# Patient Record
Sex: Female | Born: 1965
Health system: Southern US, Community
[De-identification: ages and names within clinical notes are randomized; demographics above are authoritative.]

## PROBLEM LIST (undated history)

## (undated) DIAGNOSIS — T7840XA Allergy, unspecified, initial encounter: Secondary | ICD-10-CM

## (undated) HISTORY — PX: EYE SURGERY: SHX253

## (undated) HISTORY — PX: OTHER SURGICAL HISTORY: SHX169

## (undated) HISTORY — PX: TUBAL LIGATION: SHX77

## (undated) HISTORY — PX: KNEE SURGERY: SHX244

## (undated) HISTORY — DX: Allergy, unspecified, initial encounter: T78.40XA

---

## 1998-09-20 ENCOUNTER — Encounter: Payer: Self-pay | Admitting: Obstetrics and Gynecology

## 1998-09-20 ENCOUNTER — Ambulatory Visit (HOSPITAL_COMMUNITY): Admission: RE | Admit: 1998-09-20 | Discharge: 1998-09-20 | Payer: Self-pay | Admitting: Family Medicine

## 1999-03-20 ENCOUNTER — Ambulatory Visit (HOSPITAL_COMMUNITY): Admission: RE | Admit: 1999-03-20 | Discharge: 1999-03-20 | Payer: Self-pay | Admitting: Obstetrics and Gynecology

## 1999-09-19 ENCOUNTER — Ambulatory Visit (HOSPITAL_COMMUNITY): Admission: RE | Admit: 1999-09-19 | Discharge: 1999-09-19 | Payer: Self-pay | Admitting: Obstetrics and Gynecology

## 1999-09-19 ENCOUNTER — Encounter: Payer: Self-pay | Admitting: Obstetrics and Gynecology

## 1999-10-03 ENCOUNTER — Other Ambulatory Visit: Admission: RE | Admit: 1999-10-03 | Discharge: 1999-10-03 | Payer: Self-pay | Admitting: Family Medicine

## 2000-10-02 ENCOUNTER — Other Ambulatory Visit: Admission: RE | Admit: 2000-10-02 | Discharge: 2000-10-02 | Payer: Self-pay | Admitting: *Deleted

## 2001-09-24 ENCOUNTER — Other Ambulatory Visit: Admission: RE | Admit: 2001-09-24 | Discharge: 2001-09-24 | Payer: Self-pay | Admitting: Family Medicine

## 2002-10-08 ENCOUNTER — Other Ambulatory Visit: Admission: RE | Admit: 2002-10-08 | Discharge: 2002-10-08 | Payer: Self-pay | Admitting: Family Medicine

## 2003-10-18 ENCOUNTER — Other Ambulatory Visit: Admission: RE | Admit: 2003-10-18 | Discharge: 2003-10-18 | Payer: Self-pay | Admitting: Family Medicine

## 2004-09-20 ENCOUNTER — Ambulatory Visit: Payer: Self-pay | Admitting: Internal Medicine

## 2004-10-24 ENCOUNTER — Ambulatory Visit (HOSPITAL_COMMUNITY): Admission: RE | Admit: 2004-10-24 | Discharge: 2004-10-24 | Payer: Self-pay | Admitting: Internal Medicine

## 2004-10-24 ENCOUNTER — Other Ambulatory Visit: Admission: RE | Admit: 2004-10-24 | Discharge: 2004-10-24 | Payer: Self-pay | Admitting: Family Medicine

## 2005-01-05 ENCOUNTER — Ambulatory Visit: Payer: Self-pay | Admitting: Internal Medicine

## 2005-01-24 ENCOUNTER — Ambulatory Visit: Payer: Self-pay | Admitting: Internal Medicine

## 2005-06-18 ENCOUNTER — Ambulatory Visit: Payer: Self-pay | Admitting: Internal Medicine

## 2005-07-18 ENCOUNTER — Ambulatory Visit: Payer: Self-pay | Admitting: Internal Medicine

## 2005-08-07 ENCOUNTER — Ambulatory Visit: Payer: Self-pay | Admitting: Internal Medicine

## 2005-08-14 ENCOUNTER — Ambulatory Visit: Payer: Self-pay | Admitting: Internal Medicine

## 2005-08-27 ENCOUNTER — Ambulatory Visit: Payer: Self-pay | Admitting: Internal Medicine

## 2005-10-04 ENCOUNTER — Ambulatory Visit: Payer: Self-pay | Admitting: Internal Medicine

## 2005-11-07 ENCOUNTER — Other Ambulatory Visit: Admission: RE | Admit: 2005-11-07 | Discharge: 2005-11-07 | Payer: Self-pay | Admitting: Family Medicine

## 2005-12-13 ENCOUNTER — Ambulatory Visit: Payer: Self-pay | Admitting: Internal Medicine

## 2006-04-16 ENCOUNTER — Ambulatory Visit: Payer: Self-pay | Admitting: Internal Medicine

## 2006-09-04 ENCOUNTER — Ambulatory Visit: Payer: Self-pay | Admitting: Internal Medicine

## 2007-01-09 ENCOUNTER — Other Ambulatory Visit: Admission: RE | Admit: 2007-01-09 | Discharge: 2007-01-09 | Payer: Self-pay | Admitting: Family Medicine

## 2007-01-09 ENCOUNTER — Encounter: Admission: RE | Admit: 2007-01-09 | Discharge: 2007-01-09 | Payer: Self-pay | Admitting: Family Medicine

## 2007-01-14 ENCOUNTER — Ambulatory Visit: Payer: Self-pay | Admitting: Internal Medicine

## 2007-01-30 ENCOUNTER — Ambulatory Visit: Payer: Self-pay | Admitting: Internal Medicine

## 2007-02-18 ENCOUNTER — Ambulatory Visit: Payer: Self-pay | Admitting: Internal Medicine

## 2007-04-09 ENCOUNTER — Ambulatory Visit: Payer: Self-pay | Admitting: Internal Medicine

## 2008-02-13 ENCOUNTER — Ambulatory Visit (HOSPITAL_COMMUNITY): Admission: RE | Admit: 2008-02-13 | Discharge: 2008-02-13 | Payer: Self-pay | Admitting: Family Medicine

## 2008-02-13 ENCOUNTER — Other Ambulatory Visit: Admission: RE | Admit: 2008-02-13 | Discharge: 2008-02-13 | Payer: Self-pay | Admitting: Family Medicine

## 2008-07-08 ENCOUNTER — Encounter: Admission: RE | Admit: 2008-07-08 | Discharge: 2008-07-08 | Payer: Self-pay | Admitting: Gastroenterology

## 2008-11-03 ENCOUNTER — Telehealth (INDEPENDENT_AMBULATORY_CARE_PROVIDER_SITE_OTHER): Payer: Self-pay | Admitting: *Deleted

## 2008-11-11 ENCOUNTER — Ambulatory Visit: Payer: Self-pay | Admitting: Internal Medicine

## 2008-11-11 DIAGNOSIS — J302 Other seasonal allergic rhinitis: Secondary | ICD-10-CM | POA: Insufficient documentation

## 2008-11-11 DIAGNOSIS — J3089 Other allergic rhinitis: Secondary | ICD-10-CM

## 2008-11-12 ENCOUNTER — Ambulatory Visit: Payer: Self-pay | Admitting: Internal Medicine

## 2009-02-24 ENCOUNTER — Other Ambulatory Visit: Admission: RE | Admit: 2009-02-24 | Discharge: 2009-02-24 | Payer: Self-pay | Admitting: Family Medicine

## 2009-02-24 ENCOUNTER — Ambulatory Visit (HOSPITAL_COMMUNITY): Admission: RE | Admit: 2009-02-24 | Discharge: 2009-02-24 | Payer: Self-pay | Admitting: Family Medicine

## 2009-05-17 ENCOUNTER — Encounter: Payer: Self-pay | Admitting: Internal Medicine

## 2009-05-17 ENCOUNTER — Telehealth (INDEPENDENT_AMBULATORY_CARE_PROVIDER_SITE_OTHER): Payer: Self-pay | Admitting: *Deleted

## 2009-07-13 ENCOUNTER — Ambulatory Visit: Payer: Self-pay | Admitting: Internal Medicine

## 2010-02-01 ENCOUNTER — Ambulatory Visit: Payer: Self-pay | Admitting: Internal Medicine

## 2010-02-03 ENCOUNTER — Encounter: Payer: Self-pay | Admitting: Family Medicine

## 2010-02-15 ENCOUNTER — Encounter: Admission: RE | Admit: 2010-02-15 | Discharge: 2010-02-15 | Payer: Self-pay | Admitting: Family Medicine

## 2010-02-15 ENCOUNTER — Other Ambulatory Visit: Admission: RE | Admit: 2010-02-15 | Discharge: 2010-02-15 | Payer: Self-pay | Admitting: Family Medicine

## 2010-02-15 ENCOUNTER — Ambulatory Visit: Payer: Self-pay | Admitting: Family Medicine

## 2010-02-15 ENCOUNTER — Encounter (INDEPENDENT_AMBULATORY_CARE_PROVIDER_SITE_OTHER): Payer: Self-pay | Admitting: *Deleted

## 2010-02-20 ENCOUNTER — Ambulatory Visit: Payer: Self-pay | Admitting: Family Medicine

## 2010-02-20 ENCOUNTER — Encounter (INDEPENDENT_AMBULATORY_CARE_PROVIDER_SITE_OTHER): Payer: Self-pay | Admitting: *Deleted

## 2010-02-21 ENCOUNTER — Telehealth (INDEPENDENT_AMBULATORY_CARE_PROVIDER_SITE_OTHER): Payer: Self-pay | Admitting: *Deleted

## 2010-02-21 LAB — CONVERTED CEMR LAB: Fecal Occult Bld: NEGATIVE

## 2010-02-23 ENCOUNTER — Ambulatory Visit: Payer: Self-pay | Admitting: Family Medicine

## 2010-02-24 LAB — CONVERTED CEMR LAB
AST: 20 units/L (ref 0–37)
Albumin: 3.9 g/dL (ref 3.5–5.2)
Alkaline Phosphatase: 52 units/L (ref 39–117)
BUN: 14 mg/dL (ref 6–23)
Basophils Relative: 0.6 % (ref 0.0–3.0)
CO2: 30 meq/L (ref 19–32)
Eosinophils Relative: 2.5 % (ref 0.0–5.0)
GFR calc non Af Amer: 98.57 mL/min (ref >60)
Glucose, Bld: 92 mg/dL (ref 70–99)
HCT: 33 % — ABNORMAL LOW (ref 36.0–46.0)
Lymphs Abs: 1.7 10*3/uL (ref 0.7–4.0)
MCV: 81.2 fL (ref 78.0–100.0)
Monocytes Absolute: 0.5 10*3/uL (ref 0.1–1.0)
Monocytes Relative: 8.5 % (ref 3.0–12.0)
Neutrophils Relative %: 61 % (ref 43.0–77.0)
Potassium: 5 meq/L (ref 3.5–5.1)
RBC: 4.06 M/uL (ref 3.87–5.11)
TSH: 1.69 microintl units/mL (ref 0.35–5.50)
Total Protein: 7 g/dL (ref 6.0–8.3)
WBC: 6.2 10*3/uL (ref 4.5–10.5)

## 2010-10-03 ENCOUNTER — Ambulatory Visit
Admission: RE | Admit: 2010-10-03 | Discharge: 2010-10-03 | Payer: Self-pay | Source: Home / Self Care | Attending: Family Medicine | Admitting: Family Medicine

## 2010-10-03 DIAGNOSIS — M533 Sacrococcygeal disorders, not elsewhere classified: Secondary | ICD-10-CM | POA: Insufficient documentation

## 2010-10-06 ENCOUNTER — Ambulatory Visit
Admission: RE | Admit: 2010-10-06 | Discharge: 2010-10-06 | Payer: Self-pay | Source: Home / Self Care | Attending: Family Medicine | Admitting: Family Medicine

## 2010-10-12 ENCOUNTER — Telehealth: Payer: Self-pay | Admitting: Family Medicine

## 2010-10-17 NOTE — Letter (Signed)
Summary: [facility]: Immunoassay Fecal Occult Blood (iFOB) Order Form  [facility] [place]  [address] [place], [place] [address]   Phone: [phone]  Fax: [phone]      [facility]: Immunoassay Fecal Occult Blood (iFOB) Order Form   February 15, 2010 MRN: [id number]   Erika Wagner [birth year]   Physicican Name:____yvonne lowne,do_____________________  Diagnosis Code:________v76.51__________________      [name] CMA

## 2010-10-17 NOTE — Letter (Signed)
Summary: Results Follow up Letter  [facility] [place]  [address] [place], [place] [address]   Phone: [phone]  Fax: [phone]    02/20/2010 MRN: [id number]  [facility] [address] [place], [place]  [address]  Dear Erika Wagner,  The following are the results of your recent test(s):  Test         Result    Pap Smear:        Normal __x___  Not Normal _____ Comments: ______________________________________________________ Cholesterol: LDL(Bad cholesterol):         Your goal is less than:         HDL (Good cholesterol):       Your goal is more than: Comments:  ______________________________________________________ Mammogram:        Normal _____  Not Normal _____ Comments:  ___________________________________________________________________ Hemoccult:        Normal _____  Not normal _______ Comments:    _____________________________________________________________________ Other Tests:    We routinely do not discuss normal results over the telephone.  If you desire a copy of the results, or you have any questions about this information we can discuss them at your next office visit.   Sincerely,    [name] CMA  February 20, 2010 4:19 PM

## 2010-10-17 NOTE — Miscellaneous (Signed)
Summary: Injection Orders / Strong Allergy    Injection Orders / Jet Allergy    Imported By: Lennie Odor 02/14/2010 15:23:56  _____________________________________________________________________  External Attachment:    Type:   Image     Comment:   External Document

## 2010-10-17 NOTE — Progress Notes (Signed)
Summary: lab   Phone Note Call from Patient Call back at [facility] Phone [phone]   Caller: Patient Summary of Call: PATIENT DROPPED OFF LAB WORK FROM 02/03/2010---WANTS DR LOWNE TO REVIEW--OK TO CALL PATIENT AT [phone] IF DR LOWNE NEEDS TO DISCUSS LABS  WILL TAKE TO [facility] IN [facility] Initial call taken by: Jerolyn Shin,  February 21, 2010 3:02 PM  Follow-up for Phone Call        Pt is aware she needs fasting glucose.  V70., 790.6 cbcd,bmp,hep,hga1c,tsh. [name] CMA  February 21, 2010 3:21 PM   Additional Follow-up for Phone Call Additional follow up Details #1::        lab appt scheduled [id number] Additional Follow-up by: Okey Regal [place],  February 21, 2010 3:25 PM

## 2010-10-17 NOTE — Assessment & Plan Note (Signed)
Summary: new to est/cbs   Vital Signs:  Patient profile:   45 year old female Height:      60 inches Weight:      123 pounds BMI:     24.11 Pulse rate:   87 / minute Pulse rhythm:   regular BP sitting:   114 / 74  (left arm) Cuff size:   regular  Vitals Entered By: [name] CMA Mar 10, 2010 9:58 AM) CC: Erika Wagner here for CPX, pap   History of Present Illness: Erika Wagner here for cpe, pap and labs.  No complaints.    Preventive Screening-Counseling & Management  Alcohol-Tobacco     Alcohol drinks/day: 0     Smoking Status: never  Caffeine-Diet-Exercise     Caffeine use/day: 0     Does Patient Exercise: yes     Type of exercise: walk      Exercise (avg: min/session): 30-60     Times/week: 3     Exercise Counseling: not indicated; exercise is adequate  Hep-HIV-STD-Contraception     Dental Visit-last 6 months yes     Dental Care Counseling: not indicated; dental care within six months     SBE monthly: yes     SBE Education/Counseling: not indicated; SBE done regularly      Sexual History:  currently monogamous and married.        Drug Use:  no.    Current Medications (verified): 1)  Allergy Vaccine  1:10 (W-E) 2)  Epipen 0.3 Mg/0.69ml Devi (Epinephrine) .... For Severe Allergic Rhinitis  Allergies: 1)  ! Clindamycin 2)  ! Pollyann Samples  Past History:  Past Medical History: Last updated: 02/01/2010 ASTHMATIC BRONCHITIS, ACUTE (ICD-466.0) ALLERGIC RHINITIS (ICD-477.9)  Past Surgical History: Last updated: 02/01/2010 corneal implants  Corneal rings removed from left eye 2 knee surgeries tubal ligation   Family History: Last updated: 03-10-10 Father- died MI Mother- living Family History Diabetes 1st degree relative Family History High cholesterol Family History Hypertension Family History Breast cancer --M aunt---mother does not go to doctor or have any preventative tests done  Social History: Last updated: 03-10-10 Married Patient never smoked.  Parents smoked Homemaker Alcohol use-no Drug use-no Regular exercise-yes  Risk Factors: Alcohol Use: 0 (10-Mar-2010) Caffeine Use: 0 (Mar 10, 2010) Exercise: yes (2010/03/10)  Risk Factors: Smoking Status: never (Mar 10, 2010)  Family History: Reviewed history from 02/01/2010 and no changes required. Father- died MI Mother- living Family History Diabetes 1st degree relative Family History High cholesterol Family History Hypertension Family History Breast cancer --M aunt---mother does not go to doctor or have any preventative tests done  Social History: Reviewed history from 02/01/2010 and no changes required. Married Patient never smoked. Parents smoked Homemaker Alcohol use-no Drug use-no Regular exercise-yes Does Patient Exercise:  yes Caffeine use/day:  0 Dental Care w/in 6 mos.:  yes Sexual History:  currently monogamous, married Drug Use:  no  Review of Systems      See HPI General:  Denies chills, fatigue, fever, loss of appetite, malaise, sleep disorder, sweats, weakness, and weight loss. Eyes:  Denies blurring, discharge, double vision, eye irritation, eye pain, halos, itching, light sensitivity, red eye, vision loss-1 eye, and vision loss-both eyes; optho- q1y. ENT:  Denies decreased hearing, difficulty swallowing, ear discharge, earache, hoarseness, nasal congestion, nosebleeds, postnasal drainage, ringing in ears, sinus pressure, and sore throat. CV:  Denies bluish discoloration of lips or nails, chest pain or discomfort, difficulty breathing at night, difficulty breathing while lying down, fainting, fatigue, leg cramps with exertion,  lightheadness, near fainting, palpitations, shortness of breath with exertion, swelling of feet, swelling of hands, and weight gain. Resp:  Denies chest discomfort, chest pain with inspiration, cough, coughing up blood, excessive snoring, hypersomnolence, morning headaches, pleuritic, shortness of breath, sputum productive, and  wheezing. GI:  Denies abdominal pain, bloody stools, change in bowel habits, constipation, dark tarry stools, diarrhea, excessive appetite, gas, hemorrhoids, indigestion, and loss of appetite. GU:  Denies abnormal vaginal bleeding, decreased libido, discharge, dysuria, genital sores, hematuria, incontinence, nocturia, urinary frequency, and urinary hesitancy. MS:  Denies joint pain, joint redness, joint swelling, loss of strength, low back pain, mid back pain, muscle aches, muscle , cramps, muscle weakness, stiffness, and thoracic pain. Derm:  Denies changes in color of skin, changes in nail beds, dryness, excessive perspiration, flushing, hair loss, insect bite(s), itching, lesion(s), poor wound healing, and rash. Neuro:  Denies brief paralysis, difficulty with concentration, disturbances in coordination, falling down, headaches, inability to speak, memory loss, numbness, poor balance, seizures, sensation of room spinning, tingling, tremors, visual disturbances, and weakness. Psych:  Denies alternate hallucination ( auditory/visual), anxiety, depression, easily angered, easily tearful, irritability, mental problems, panic attacks, sense of great danger, suicidal thoughts/plans, thoughts of violence, unusual visions or sounds, and thoughts /plans of harming others. Endo:  Denies cold intolerance, excessive hunger, excessive thirst, excessive urination, heat intolerance, polyuria, and weight change. Heme:  Denies abnormal bruising, bleeding, enlarge lymph nodes, fevers, pallor, and skin discoloration. Allergy:  Denies hives or rash, itching eyes, persistent infections, seasonal allergies, and sneezing.  Physical Exam  General:  Well-developed,well-nourished,in no acute distress; alert,appropriate and cooperative throughout examination Head:  Normocephalic and atraumatic without obvious abnormalities. No apparent alopecia or balding. Eyes:  vision grossly intact, pupils equal, pupils round, pupils  reactive to light, and no injection.   Ears:  External ear exam shows no significant lesions or deformities.  Otoscopic examination reveals clear canals, tympanic membranes are intact bilaterally without bulging, retraction, inflammation or discharge. Hearing is grossly normal bilaterally. Nose:  External nasal examination shows no deformity or inflammation. Nasal mucosa are pink and moist without lesions or exudates. Mouth:  Oral mucosa and oropharynx without lesions or exudates.  Teeth in good repair. Neck:  No deformities, masses, or tenderness noted. Chest Wall:  No deformities, masses, or tenderness noted. Breasts:  No mass, nodules, thickening, tenderness, bulging, retraction, inflamation, nipple discharge or skin changes noted.   Lungs:  Normal respiratory effort, chest expands symmetrically. Lungs are clear to auscultation, no crackles or wheezes. Heart:  normal rate and no murmur.   Abdomen:  Bowel sounds positive,abdomen soft and non-tender without masses, organomegaly or hernias noted. Rectal:  No external abnormalities noted. Normal sphincter tone. No rectal masses or tenderness. Genitalia:  Pelvic Exam:        External: normal female genitalia without lesions or masses        Vagina: normal without lesions or masses        Cervix: normal without lesions or masses        Adnexa: normal bimanual exam without masses or fullness        Uterus: normal by palpation        Pap smear: performed Msk:  normal ROM, no joint tenderness, no joint swelling, no joint warmth, no redness over joints, no joint deformities, no joint instability, and no crepitation.   Pulses:  R posterior tibial normal, R dorsalis pedis normal, R carotid normal, L popliteal normal, L posterior tibial normal, and L dorsalis pedis normal.  Extremities:  No clubbing, cyanosis, edema, or deformity noted with normal full range of motion of all joints.   Neurologic:  No cranial nerve deficits noted. Station and gait are  normal. Plantar reflexes are down-going bilaterally. DTRs are symmetrical throughout. Sensory, motor and coordinative functions appear intact. Skin:  Intact without suspicious lesions or rashes Cervical Nodes:  No lymphadenopathy noted Axillary Nodes:  No palpable lymphadenopathy Psych:  Cognition and judgment appear intact. Alert and cooperative with normal attention span and concentration. No apparent delusions, illusions, hallucinations   Impression & Recommendations:  Problem # 1:  PREVENTIVE HEALTH CARE (ICD-V70.0)  need labs from ins cpe ghm utd  Orders: EKG w/ Interpretation (93000)  Complete Medication List: 1)  Allergy Vaccine 1:10 (w-e)  2)  Epipen 0.3 Mg/0.58ml Devi (Epinephrine) .... For severe allergic rhinitis   EKG  Procedure date:  02/15/2010  Findings:      Normal sinus rhythm with rate of:  83   TD Result Date:  03/03/2008 TD Result:  given TD Next Due:  10 yr

## 2010-10-17 NOTE — Assessment & Plan Note (Signed)
Summary: ROV/ MBW   Primary Provider/Referring Provider:  Lanell Persons  CC:  follow up visit-allergies; need EpiPen RX.Marland Kitchen  History of Present Illness: ASTHMATIC BRONCHITIS, ACUTE (ICD-466.0) ALLERGIC RHINITIS (ICD-477.9)  01/30/07- 1. Allergic rhinitis. 2. Asthmatic bronchitis.  HISTORY:  Good winter and spring with no acute problems or issues in quite a while.  She continues allergy vaccine at 1:10 with no reactions. She has an EpiPen but has never needed it.  She is quite pleased with her status.   11/12/08-Allergic rhinitis, asthmatic bronchitis Vaccine f/u no problems. definitiely helps. No problems "I wouldn't give them up for anything". Feels very well with no complaints and problems. No significant repiratory problems through winter. Denies nasal congestion, sneeze, itching/ watery eyes, cough, wheeze or rash.  02/02/10- Allergic rhinitis, asthmatic bronchitis She has done very well with her allergy shots. This spring a neighbor mowed when windows were open- blamed for short term nasal congestion flare.  No problem with her chest. Today she feels well, wanting to continue allergy vaccine.     Current Medications (verified): 1)  Allergy Vaccine  1:10  Allergies (verified): 1)  ! Clindamycin 2)  ! Pollyann Samples  Past History:  Family History: Last updated: 02-Feb-2010 Father- died MI Mother- living  Social History: Last updated: Feb 02, 2010 Married Patient never smoked. Parents smoked Homemaker  Risk Factors: Smoking Status: never (11/12/2008)  Past Medical History: ASTHMATIC BRONCHITIS, ACUTE (ICD-466.0) ALLERGIC RHINITIS (ICD-477.9)  Past Surgical History: corneal implants  Corneal rings removed from left eye 2 knee surgeries tubal ligation   Family History: Father- died MI Mother- living  Social History: Married Patient never smoked. Parents smoked Homemaker  Review of Systems      See HPI       The patient complains of nasal  congestion/difficulty breathing through nose and sneezing.  The patient denies shortness of breath with activity, shortness of breath at rest, productive cough, non-productive cough, coughing up blood, chest pain, irregular heartbeats, acid heartburn, indigestion, loss of appetite, weight change, abdominal pain, difficulty swallowing, sore throat, tooth/dental problems, and headaches.    Vital Signs:  Patient profile:   45 year old female Weight:      124 pounds O2 Sat:      98 % on Room air Pulse rate:   90 / minute BP sitting:   112 / 74  (left arm) Cuff size:   regular  O2 Flow:  Room air  Physical Exam  Additional Exam:  General: A/Ox3; pleasant and cooperative, NAD, SKIN: no rash, lesions NODES: no lymphadenopathy HEENT: [place]/AT, EOM- WNL, Conjuctivae- clear, PERRLA, TM-WNL, Nose- clear, Throat- clear and wnl , Mallampati II, clear voice NECK: Supple w/ fair ROM, JVD- none, normal carotid impulses w/o bruits Thyroid- normal to palpation CHEST: Clear to P&A HEART: RRR, no m/g/r heard ABDOMEN: Soft and nl; nml bowel sounds;  ZOX:WRUE, nl pulses, no edema  NEURO: Grossly intact to observation      Impression & Recommendations:  Problem # 1:  ALLERGIC RHINITIS (ICD-477.9)  She had one surge of rhinitis, helped by Neti pot, after a single intense exposure. Otherwise she has continued to do great and is stable. We can reassess if needed. We will continue her allergy vaccine with present mix.  Problem # 2:  ASTHMATIC BRONCHITIS, ACUTE (ICD-466.0) Assessment: Comment Only No recent wheeze of cough. she feels well controlled.  Medications Added to Medication List This Visit: 1)  Allergy Vaccine 1:10 (w-e)  2)  Epipen 0.3 Mg/0.12ml Hardie Pulley (  Epinephrine) .... For severe allergic rhinitis  Other Orders: Est. Patient Level III (25956)  Patient Instructions: 1)  Please schedule a follow-up appointment in 1 year. 2)  Script for Epipen 3)  Continue allergy vaccine

## 2010-10-19 ENCOUNTER — Encounter: Payer: Self-pay | Admitting: Family Medicine

## 2010-10-19 ENCOUNTER — Other Ambulatory Visit: Payer: Self-pay | Admitting: Family Medicine

## 2010-10-19 ENCOUNTER — Ambulatory Visit (INDEPENDENT_AMBULATORY_CARE_PROVIDER_SITE_OTHER)
Admission: RE | Admit: 2010-10-19 | Discharge: 2010-10-19 | Disposition: A | Discharge status: Home / Self Care | Payer: Self-pay | Source: Ambulatory Visit | Attending: Family Medicine | Admitting: Family Medicine

## 2010-10-19 DIAGNOSIS — M533 Sacrococcygeal disorders, not elsewhere classified: Secondary | ICD-10-CM

## 2010-10-19 NOTE — Assessment & Plan Note (Signed)
Summary: PER HUSBAND PT LOST HER VOICE/SINUS//KN   Vital Signs:  Patient profile:   45 year old female Weight:      123 pounds BMI:     24.11 Temp:     98.5 degrees F oral BP sitting:   112 / 70  (left arm)  Vitals Entered By: Doristine Devoid CMA (October 06, 2010 9:04 AM) CC: sore throat    History of Present Illness: 45 yo woman here today for sinus congestion.  sxs started Tuesday night w/ sore throat.  lost voice this AM.  denies sinus pain/pressure.  low grade temps, Tm 99.6.  some chills at night.  no ear pain.  mild cough due to PND.  hasn't taken any meds.  Current Medications (verified): 1)  Allergy Vaccine  1:10 (W-E) 2)  Epipen 0.3 Mg/0.76ml Devi (Epinephrine) .... For Severe Allergic Rhinitis  Allergies (verified): 1)  ! Clindamycin 2)  ! * Emycin  Review of Systems      See HPI  Physical Exam  General:  Well-developed,well-nourished,in no acute distress; alert,appropriate and cooperative throughout examination Head:  Normocephalic and atraumatic without obvious abnormalities. No apparent alopecia or balding.  no TTP over sinuses Eyes:  no injxn or inflammation Ears:  External ear exam shows no significant lesions or deformities.  Otoscopic examination reveals clear canals, tympanic membranes are intact bilaterally without bulging, retraction, inflammation or discharge. Hearing is grossly normal bilaterally. Nose:  External nasal examination shows no deformity or inflammation. Nasal mucosa are pink and moist without lesions or exudates. Mouth:  mild pharyngeal erythema, some PND Neck:  No deformities, masses, or tenderness noted. Lungs:  Normal respiratory effort, chest expands symmetrically. Lungs are clear to auscultation, no crackles or wheezes.   Impression & Recommendations:  Problem # 1:  PHARYNGITIS-ACUTE (ICD-462) Assessment New pt's rapid strep negative.  sxs most likely viral.  reviewed supportive care and red flags that should prompt return. Pt  expresses understanding and is in agreement w/ this plan. Orders: Rapid Strep (63875)  Complete Medication List: 1)  Allergy Vaccine 1:10 (w-e)  2)  Epipen 0.3 Mg/0.50ml Devi (Epinephrine) .... For severe allergic rhinitis  Patient Instructions: 1)  This appears to be a virus and should improve w/ time (they can last 7-10 days) 2)  Drink plenty of fluids 3)  Take ibuprofen as needed for pain and inflammation 4)  Rest! 5)  Hang in there!!!   Orders Added: 1)  Est. Patient Level III [64332] 2)  Rapid Strep [95188]

## 2010-10-19 NOTE — Progress Notes (Signed)
Summary: Still not Better  Phone Note Call from Patient   Caller: Patient Summary of Call: Pt is not doing any better from last ov. Is there something  that can be called in for her. She says that she believes the cold has gone into her lungs now. Please advise [phone] [facility] Initial call taken by: Lavell Islam,  October 12, 2010 8:46 AM  Follow-up for Phone Call        Pt c/o congestion, stuffiness, increase coughing with greenish mucous, and chest wheezing. [facility] Pt seen on 09-2010. Pls advise..........Marland KitchenFelecia Deloach CMA  October 12, 2010 11:41 AM   Additional Follow-up for Phone Call Additional follow up Details #1::        will call in a Zpack for suspected bronchitis.  should add Mucinex if she's not already taking to break up her congestion.  if no better in 7 days will need f/u appt Additional Follow-up by: Neena Rhymes MD,  October 12, 2010 11:50 AM    Additional Follow-up for Phone Call Additional follow up Details #2::    Pt aware Rx sent to pharmacy.Felecia Deloach CMA  October 12, 2010 12:00 PM   New/Updated Medications: AZITHROMYCIN 250 MG  TABS (AZITHROMYCIN) 2 by  mouth today and then 1 daily for 4 days Prescriptions: AZITHROMYCIN 250 MG  TABS (AZITHROMYCIN) 2 by  mouth today and then 1 daily for 4 days  #6 x 0   Entered and Authorized by:   Neena Rhymes MD   Signed by:   Neena Rhymes MD on 10/12/2010   Method used:   [facility]ronically to        [facility] [address] [phone]* (retail)       [address]       [place], [place]  [address]       Ph: [phone]       Fax: [phone]   RxID:   [id number]

## 2010-10-19 NOTE — Assessment & Plan Note (Signed)
Summary: pain in tail bone//fd   Vital Signs:  Patient profile:   45 year old female Weight:      125.2 pounds Pulse rate:   68 / minute Pulse rhythm:   regular BP sitting:   124 / 80  (right arm) Cuff size:   regular  Vitals Entered By: Almeta Monas CMA Duncan Dull) (October 03, 2010 3:57 PM) CC: c/o tailbone pain after sitting on hard surfaces   History of Present Illness: Pt here c/o pain in tailbone pain x almost 1 month.  No known injury.  No constipation, hemrrhoids, etc.    Current Medications (verified): 1)  Allergy Vaccine  1:10 (W-E) 2)  Epipen 0.3 Mg/0.4ml Devi (Epinephrine) .... For Severe Allergic Rhinitis  Allergies (verified): 1)  ! Clindamycin 2)  ! Pollyann Samples  Family History: Reviewed history from 02/15/2010 and no changes required. Father- died MI Mother- living Family History Diabetes 1st degree relative Family History High cholesterol Family History Hypertension Family History Breast cancer --M aunt---mother does not go to doctor or have any preventative tests done  Social History: Reviewed history from 02/15/2010 and no changes required. Married Patient never smoked. Parents smoked Homemaker Alcohol use-no Drug use-no Regular exercise-yes  Review of Systems      See HPI  Physical Exam  General:  Well-developed,well-nourished,in no acute distress; alert,appropriate and cooperative throughout examination Abdomen:  Bowel sounds positive,abdomen soft and non-tender without masses, organomegaly or hernias noted. Rectal:  No pilonidal cyst no external abnormalities and no hemorrhoids.   Psych:  Oriented X3 and normally interactive.     Impression & Recommendations:  Problem # 1:  COCCYGEAL PAIN (ICD-724.79) con't to use cushion tylenol or advil ice alt heat rto if no better 7-10 days  Orders: T-Coccyx/Sacrum 2 Views (44010UV)  Complete Medication List: 1)  Allergy Vaccine 1:10 (w-e)  2)  Epipen 0.3 Mg/0.15ml Devi (Epinephrine) .... For  severe allergic rhinitis   Orders Added: 1)  T-Coccyx/Sacrum 2 Views [72220TC] 2)  Est. Patient Level III [25366]

## 2010-10-27 ENCOUNTER — Telehealth: Payer: Self-pay | Admitting: Family Medicine

## 2010-11-02 NOTE — Progress Notes (Signed)
Summary: Status on pt  Phone Note Call from Patient   Caller: Patient Summary of Call: Pt states that she has tried both hot and cold compresses still having pain. The pain is only when she gets up. When she is sitting she is okay. She states that it is a radiant pain. Please advise. Initial call taken by: [name] CMA,  October 27, 2010 1:52 PM  Follow-up for Phone Call        we can refer to ortho Follow-up by: Loreen Freud DO,  October 27, 2010 2:49 PM  Additional Follow-up for Phone Call Additional follow up Details #1::        I spoke w/ pt she is aware. [name] CMA  October 27, 2010 3:01 PM

## 2010-11-16 ENCOUNTER — Other Ambulatory Visit: Payer: Self-pay | Admitting: Family Medicine

## 2010-11-16 DIAGNOSIS — Z1231 Encounter for screening mammogram for malignant neoplasm of breast: Secondary | ICD-10-CM

## 2010-11-25 ENCOUNTER — Encounter: Payer: Self-pay | Admitting: Family Medicine

## 2010-12-05 NOTE — Letter (Signed)
Summary: [facility]   Imported By: Maryln Gottron 11/29/2010 15:56:38  _____________________________________________________________________  External Attachment:    Type:   Image     Comment:   External Document

## 2011-01-02 ENCOUNTER — Ambulatory Visit (INDEPENDENT_AMBULATORY_CARE_PROVIDER_SITE_OTHER): Payer: PRIVATE HEALTH INSURANCE

## 2011-01-02 DIAGNOSIS — J309 Allergic rhinitis, unspecified: Secondary | ICD-10-CM

## 2011-01-30 NOTE — Assessment & Plan Note (Signed)
[facility]                             PULMONARY OFFICE NOTE   NAME:Erika Wagner, Erika Wagner                        MRN:          [id number]  DATE:01/30/2007                            DOB:          [birth year]    PROBLEMS:  1. Allergic rhinitis.  2. Asthmatic bronchitis.   HISTORY:  Good winter and spring with no acute problems or issues in  quite a while.  She continues allergy vaccine at 1:10 with no reactions.  She has an EpiPen but has never needed it.  She is quite pleased with  her status.   MEDICATIONS:  She has stopped using Advair, albuterol, and Allegra.  Is  really only taking an allergy vaccine at this time.  No medication  allergy.   OBJECTIVE:  VITAL SIGNS:  Weight 118 pounds.  BP 102/60, pulse 100, room  air saturation 98%.  HEENT:  There is minimal periorbital puffiness, but her nasal airway is  clear.  CHEST:  Clear to P&A.  CARDIAC:  Heart sounds regular without murmur.   IMPRESSION:  Allergic rhinitis and asthma, under good control.   PLAN:  Continue vaccine.  May try reducing interval to every other week.  Schedule return in one year, earlier p.r.n.     Clinton D. Maple Hudson, MD, FCCP, FACP     CDY/MedQ  DD: 01/30/2007  DT: 01/31/2007  Job #: [id number]   cc:   Vikki Ports, M.D.

## 2011-02-14 ENCOUNTER — Encounter: Payer: Self-pay | Admitting: Family Medicine

## 2011-02-20 ENCOUNTER — Other Ambulatory Visit (HOSPITAL_COMMUNITY)
Admission: RE | Admit: 2011-02-20 | Discharge: 2011-02-20 | Disposition: A | Discharge status: Home / Self Care | Payer: PRIVATE HEALTH INSURANCE | Source: Ambulatory Visit | Attending: Family Medicine | Admitting: Family Medicine

## 2011-02-20 ENCOUNTER — Ambulatory Visit
Admission: RE | Admit: 2011-02-20 | Discharge: 2011-02-20 | Disposition: A | Discharge status: Home / Self Care | Payer: PRIVATE HEALTH INSURANCE | Source: Ambulatory Visit | Attending: Family Medicine | Admitting: Family Medicine

## 2011-02-20 ENCOUNTER — Ambulatory Visit (INDEPENDENT_AMBULATORY_CARE_PROVIDER_SITE_OTHER): Payer: PRIVATE HEALTH INSURANCE | Admitting: Family Medicine

## 2011-02-20 ENCOUNTER — Encounter: Payer: Self-pay | Admitting: Family Medicine

## 2011-02-20 VITALS — BP 110/74 | HR 83 | Temp 98.5°F | Resp 12 | Ht 60.0 in | Wt 121.4 lb

## 2011-02-20 DIAGNOSIS — Z01419 Encounter for gynecological examination (general) (routine) without abnormal findings: Secondary | ICD-10-CM | POA: Insufficient documentation

## 2011-02-20 DIAGNOSIS — Z0000 Encounter for general adult medical examination without abnormal findings: Secondary | ICD-10-CM

## 2011-02-20 DIAGNOSIS — Z1231 Encounter for screening mammogram for malignant neoplasm of breast: Secondary | ICD-10-CM

## 2011-02-20 LAB — POCT URINALYSIS DIPSTICK
Glucose, UA: NEGATIVE
Nitrite, UA: NEGATIVE
Protein, UA: 100
Spec Grav, UA: 1.005
Urobilinogen, UA: NEGATIVE

## 2011-02-20 LAB — LIPID PANEL
Cholesterol: 188 mg/dL (ref 0–200)
LDL Cholesterol: 101 mg/dL — ABNORMAL HIGH (ref 0–99)
Total CHOL/HDL Ratio: 3
VLDL: 15 mg/dL (ref 0.0–40.0)

## 2011-02-20 LAB — CBC WITH DIFFERENTIAL/PLATELET
Basophils Absolute: 0 10*3/uL (ref 0.0–0.1)
Eosinophils Relative: 1.2 % (ref 0.0–5.0)
HCT: 36.7 % (ref 36.0–46.0)
Lymphocytes Relative: 25.6 % (ref 12.0–46.0)
Lymphs Abs: 1.7 10*3/uL (ref 0.7–4.0)
Monocytes Relative: 6.9 % (ref 3.0–12.0)
Neutrophils Relative %: 65.7 % (ref 43.0–77.0)
Platelets: 318 10*3/uL (ref 150.0–400.0)
RDW: 14.5 % (ref 11.5–14.6)
WBC: 6.6 10*3/uL (ref 4.5–10.5)

## 2011-02-20 LAB — HEPATIC FUNCTION PANEL
ALT: 13 U/L (ref 0–35)
AST: 20 U/L (ref 0–37)
Alkaline Phosphatase: 55 U/L (ref 39–117)
Bilirubin, Direct: 0.1 mg/dL (ref 0.0–0.3)
Total Bilirubin: 0.3 mg/dL (ref 0.3–1.2)

## 2011-02-20 LAB — BASIC METABOLIC PANEL
BUN: 15 mg/dL (ref 6–23)
Calcium: 9.1 mg/dL (ref 8.4–10.5)
Creatinine, Ser: 0.8 mg/dL (ref 0.4–1.2)
GFR: 83.93 mL/min (ref >60.00)
Glucose, Bld: 102 mg/dL — ABNORMAL HIGH (ref 70–99)
Potassium: 5.3 mEq/L — ABNORMAL HIGH (ref 3.5–5.1)

## 2011-02-20 LAB — TSH: TSH: 1.9 u[IU]/mL (ref 0.35–5.50)

## 2011-02-20 NOTE — Patient Instructions (Signed)

## 2011-02-20 NOTE — Progress Notes (Signed)
  Subjective:     Erika Wagner is a 45 y.o. female and is here for a comprehensive physical exam. The patient reports no problems.  History   Social History  . Marital Status: Married    Spouse Name: N/A    Number of Children: N/A  . Years of Education: N/A   Occupational History  . housewife    Social History Main Topics  . Smoking status: Never Smoker   . Smokeless tobacco: Never Used  . Alcohol Use: No  . Drug Use: No  . Sexually Active: Yes -- Female partner(s)   Other Topics Concern  . Not on file   Social History Narrative  . No narrative on file   Health Maintenance  Topic Date Due  . Pap Smear  11/17/1984  . Tetanus/tdap  03/03/2018    The following portions of the patient's history were reviewed and updated as appropriate: allergies, current medications, past family history, past medical history, past social history, past surgical history and problem list.  Review of Systems  Review of Systems  Constitutional: Negative for activity change, appetite change and fatigue.  HENT: Negative for hearing loss, congestion, tinnitus and ear discharge.  dentist q27m Eyes: Negative for visual disturbance (see optho q1y -- vision corrected to 20/20 with glasses).  Respiratory: Negative for cough, chest tightness and shortness of breath.   Cardiovascular: Negative for chest pain, palpitations and leg swelling.  Gastrointestinal: Negative for abdominal pain, diarrhea, constipation and abdominal distention.  Genitourinary: Negative for urgency, frequency, decreased urine volume and difficulty urinating.  Musculoskeletal: Negative for back pain, arthralgias and gait problem.  Skin: Negative for color change, pallor and rash.  Neurological: Negative for dizziness, light-headedness, numbness and headaches.  Hematological: Negative for adenopathy. Does not bruise/bleed easily.  Psychiatric/Behavioral: Negative for suicidal ideas, confusion, sleep disturbance, self-injury, dysphoric  mood, decreased concentration and agitation.   Derm --H0Q   Objective:    BP 110/74  Pulse 83  Temp(Src) 98.5 F (36.9 C) (Oral)  Resp 12  Ht 5' (1.524 m)  Wt 121 lb 6.4 oz (55.067 kg)  BMI 23.71 kg/m2  SpO2 99%  LMP 02/14/2011 General appearance: alert, cooperative, appears stated age and no distress Head: Normocephalic, without obvious abnormality, atraumatic Eyes: conjunctivae/corneas clear. PERRL, EOM's intact. Fundi benign. Ears: normal TM's and external ear canals both ears Nose: Nares normal. Septum midline. Mucosa normal. No drainage or sinus tenderness. Throat: lips, mucosa, and tongue normal; teeth and gums normal Neck: no adenopathy, no carotid bruit, no JVD, supple, symmetrical, trachea midline and thyroid not enlarged, symmetric, no tenderness/mass/nodules Lungs: clear to auscultation bilaterally Breasts: normal appearance, no masses or tenderness Heart: regular rate and rhythm, S1, S2 normal, no murmur, click, rub or gallop Abdomen: soft, non-tender; bowel sounds normal; no masses,  no organomegaly Pelvic: cervix normal in appearance, external genitalia normal, no adnexal masses or tenderness, no cervical motion tenderness, rectovaginal septum normal, uterus normal size, shape, and consistency and vagina normal without discharge Extremities: extremities normal, atraumatic, no cyanosis or edema Pulses: 2+ and symmetric Skin: Skin color, texture, turgor normal. No rashes or lesions Lymph nodes: Cervical, supraclavicular, and axillary nodes normal. Neurologic: Grossly normal    Assessment:    Healthy female exam.      Plan:    check fasting labs ghm utd mammo done today Pap done See After Visit Summary for Counseling Recommendations

## 2011-02-21 ENCOUNTER — Telehealth: Payer: Self-pay | Admitting: *Deleted

## 2011-02-21 NOTE — Telephone Encounter (Signed)
Discuss with patient  

## 2011-02-21 NOTE — Telephone Encounter (Signed)
Can take 600mg  ibuprofen (3 tabs) every 6-8 hrs prn for pain.

## 2011-02-28 ENCOUNTER — Encounter: Payer: Self-pay | Admitting: *Deleted

## 2011-02-28 ENCOUNTER — Telehealth: Payer: Self-pay | Admitting: *Deleted

## 2011-02-28 NOTE — Telephone Encounter (Addendum)
Left message to call office.  Message copied by Verdene Rio on Wed Feb 28, 2011  9:01 AM ------      Message from: Lelon Perla      Created: Sun Feb 25, 2011  8:17 PM       Overall great!  K is elevated----repeat next week  Bmp   276.7

## 2011-03-01 ENCOUNTER — Other Ambulatory Visit: Payer: Self-pay | Admitting: Family Medicine

## 2011-03-01 DIAGNOSIS — E875 Hyperkalemia: Secondary | ICD-10-CM

## 2011-03-02 ENCOUNTER — Other Ambulatory Visit (INDEPENDENT_AMBULATORY_CARE_PROVIDER_SITE_OTHER): Payer: PRIVATE HEALTH INSURANCE

## 2011-03-02 DIAGNOSIS — E875 Hyperkalemia: Secondary | ICD-10-CM

## 2011-03-02 LAB — BASIC METABOLIC PANEL
CO2: 27 mEq/L (ref 19–32)
Calcium: 9.2 mg/dL (ref 8.4–10.5)
Creatinine, Ser: 0.7 mg/dL (ref 0.4–1.2)
GFR: 94.92 mL/min (ref >60.00)
Glucose, Bld: 97 mg/dL (ref 70–99)
Sodium: 137 mEq/L (ref 135–145)

## 2011-03-02 NOTE — Progress Notes (Signed)
Labs only

## 2011-03-05 ENCOUNTER — Encounter: Payer: Self-pay | Admitting: *Deleted

## 2011-03-05 NOTE — Telephone Encounter (Signed)
Pt made aware, see previous result note.

## 2011-03-15 ENCOUNTER — Other Ambulatory Visit (INDEPENDENT_AMBULATORY_CARE_PROVIDER_SITE_OTHER): Payer: PRIVATE HEALTH INSURANCE

## 2011-03-15 ENCOUNTER — Telehealth: Payer: Self-pay | Admitting: Family Medicine

## 2011-03-15 DIAGNOSIS — N949 Unspecified condition associated with female genital organs and menstrual cycle: Secondary | ICD-10-CM

## 2011-03-15 DIAGNOSIS — R102 Pelvic and perineal pain: Secondary | ICD-10-CM

## 2011-03-15 DIAGNOSIS — R300 Dysuria: Secondary | ICD-10-CM

## 2011-03-15 LAB — POCT URINALYSIS DIPSTICK
Nitrite, UA: NEGATIVE
Protein, UA: NEGATIVE
Spec Grav, UA: 1.01
Urobilinogen, UA: 0.2

## 2011-03-15 MED ORDER — CIPROFLOXACIN HCL 500 MG PO TABS: 500.0000 mg | ORAL_TABLET | Freq: Two times a day (BID) | ORAL | Status: AC

## 2011-03-15 NOTE — Progress Notes (Signed)
Labs only

## 2011-03-15 NOTE — Telephone Encounter (Signed)
ERROR

## 2011-03-16 ENCOUNTER — Other Ambulatory Visit: Payer: PRIVATE HEALTH INSURANCE

## 2011-09-14 ENCOUNTER — Ambulatory Visit (INDEPENDENT_AMBULATORY_CARE_PROVIDER_SITE_OTHER): Payer: PRIVATE HEALTH INSURANCE | Admitting: Internal Medicine

## 2011-09-14 ENCOUNTER — Encounter: Payer: Self-pay | Admitting: Internal Medicine

## 2011-09-14 VITALS — BP 118/70 | HR 86 | Ht 60.0 in | Wt 128.4 lb

## 2011-09-14 DIAGNOSIS — J209 Acute bronchitis, unspecified: Secondary | ICD-10-CM

## 2011-09-14 DIAGNOSIS — J309 Allergic rhinitis, unspecified: Secondary | ICD-10-CM

## 2011-09-14 MED ORDER — EPINEPHRINE 0.3 MG/0.3ML IJ DEVI: 0.3000 mg | Freq: Once | INTRAMUSCULAR | Status: DC

## 2011-09-14 MED ORDER — AZITHROMYCIN 250 MG PO TABS: ORAL_TABLET | ORAL | Status: DC

## 2011-09-14 NOTE — Progress Notes (Signed)
09/14/11- 45 yoF never smoker followed for allergic rhinitis, asthmatic bronchitis LOV-Feb 01 2010 She declines flu vaccine. She continues allergy vaccine and strongly believes that it continues to help her. If she is late for a dose she notices progressive nasal and chest congestion. No acute respiratory problems recently. She has not needed a rescue inhaler. 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  ROS-see HPI Constitutional:   No-   weight loss, night sweats, fevers, chills, fatigue, lassitude. HEENT:   No-  headaches, difficulty swallowing, tooth/dental problems, sore throat,       No-  sneezing, itching, ear ache, nasal congestion, post nasal drip,  CV:  No-   chest pain, orthopnea, PND, swelling in lower extremities, anasarca,  dizziness, palpitations Resp: No-   shortness of breath with exertion or at rest.              No-   productive cough,  No non-productive cough,  No- coughing up of blood.              No-   change in color of mucus.  No- wheezing.   Skin: No-   rash or lesions. GI:  No-   heartburn, indigestion, abdominal pain, nausea, vomiting, diarrhea,                 change in bowel habits, loss of appetite GU:  MS:  No-   joint pain or swelling.  No- decreased range of motion.  No- back pain. Neuro-     nothing unusual Psych:  No- change in mood or affect. No depression or anxiety.  No memory loss.  OBJ General- Alert, Oriented, Affect-appropriate, Distress- none acute Skin- rash-none, lesions- none, excoriation- none Lymphadenopathy- none Head- atraumatic            Eyes- Gross vision intact, PERRLA, conjunctivae clear secretions            Ears- Hearing, canals-normal            Nose- Clear, no-Septal dev, mucus, polyps, erosion, perforation             Throat- Mallampati II , mucosa clear , drainage- none, tonsils- atrophic Neck- flexible , trachea midline, no stridor , thyroid nl, carotid no bruit Chest  - symmetrical excursion , unlabored           Heart/CV- RRR , no murmur , no gallop  , no rub, nl s1 s2                           - JVD- none , edema- none, stasis changes- none, varices- none           Lung- clear to P&A, wheeze- none, cough- none , dullness-none, rub- none           Chest wall-  Abd- tender-no, distended-no, bowel sounds-present, HSM- no Br/ Gen/ Rectal- Not done, not indicated Extrem- cyanosis- none, clubbing, none, atrophy- none, strength- nl Neuro- grossly intact to observation

## 2011-09-14 NOTE — Patient Instructions (Signed)
Script for Epipen to have while on allergy vaccine  Script to hold for Z pak antibiotic  Ok to continue allergy vaccine. Please call as needed.

## 2011-09-16 NOTE — Assessment & Plan Note (Signed)
She is convinced that allergy vaccine continues to help her. We have reviewed risk benefit and goals.

## 2011-09-16 NOTE — Assessment & Plan Note (Signed)
Good control without need for prescription intervention. We outlined options in case her status changes.

## 2011-09-17 ENCOUNTER — Ambulatory Visit (INDEPENDENT_AMBULATORY_CARE_PROVIDER_SITE_OTHER): Payer: PRIVATE HEALTH INSURANCE

## 2011-09-17 DIAGNOSIS — J309 Allergic rhinitis, unspecified: Secondary | ICD-10-CM

## 2012-01-28 ENCOUNTER — Other Ambulatory Visit: Payer: Self-pay | Admitting: Family Medicine

## 2012-01-28 DIAGNOSIS — Z1231 Encounter for screening mammogram for malignant neoplasm of breast: Secondary | ICD-10-CM

## 2012-02-05 ENCOUNTER — Ambulatory Visit (INDEPENDENT_AMBULATORY_CARE_PROVIDER_SITE_OTHER): Payer: PRIVATE HEALTH INSURANCE

## 2012-02-05 DIAGNOSIS — J309 Allergic rhinitis, unspecified: Secondary | ICD-10-CM

## 2012-02-27 ENCOUNTER — Ambulatory Visit
Admission: RE | Admit: 2012-02-27 | Discharge: 2012-02-27 | Disposition: A | Discharge status: Home / Self Care | Payer: BC Managed Care – PPO | Source: Ambulatory Visit | Attending: Family Medicine | Admitting: Family Medicine

## 2012-02-27 ENCOUNTER — Other Ambulatory Visit: Payer: Self-pay | Admitting: Family Medicine

## 2012-02-27 DIAGNOSIS — Z1231 Encounter for screening mammogram for malignant neoplasm of breast: Secondary | ICD-10-CM

## 2012-09-01 ENCOUNTER — Ambulatory Visit (INDEPENDENT_AMBULATORY_CARE_PROVIDER_SITE_OTHER): Payer: BC Managed Care – PPO | Admitting: Internal Medicine

## 2012-09-01 ENCOUNTER — Encounter: Payer: Self-pay | Admitting: Internal Medicine

## 2012-09-01 VITALS — BP 102/68 | HR 95 | Ht 60.0 in | Wt 107.6 lb

## 2012-09-01 DIAGNOSIS — J309 Allergic rhinitis, unspecified: Secondary | ICD-10-CM

## 2012-09-01 DIAGNOSIS — J302 Other seasonal allergic rhinitis: Secondary | ICD-10-CM

## 2012-09-01 DIAGNOSIS — J3089 Other allergic rhinitis: Secondary | ICD-10-CM

## 2012-09-01 MED ORDER — EPINEPHRINE 0.3 MG/0.3ML IJ DEVI: 0.3000 mg | Freq: Once | INTRAMUSCULAR | Status: DC

## 2012-09-01 NOTE — Patient Instructions (Addendum)
We can continue allergy vaccine at 1:10  Refill script for epipen to hold

## 2012-09-01 NOTE — Progress Notes (Signed)
09/14/11- 45 yoF never smoker followed for allergic rhinitis, asthmatic bronchitis LOV-Feb 01 2010 She declines flu vaccine. She continues allergy vaccine and strongly believes that it continues to help her. If she is late for a dose she notices progressive nasal and chest congestion. No acute respiratory problems recently. She has not needed a rescue inhaler. 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  09/01/12- 46 yoF never smoker followed for allergic rhinitis, asthmatic bronchitis FOLLOWS FOR: doing [place]!!! Still on vaccine and no complaints. Specifically with no respiratory or allergy complaints. She declines flu vaccine. Occasionally uses Neti pot and antihistamine She wants to continue allergy vaccine (1:10 GO) after discussing risks, goals and duration of therapy.  ROS-see HPI Constitutional:   No-   weight loss, night sweats, fevers, chills, fatigue, lassitude. HEENT:   No-  headaches, difficulty swallowing, tooth/dental problems, sore throat,       No-  sneezing, itching, ear ache, nasal congestion, post nasal drip,  CV:  No-   chest pain, orthopnea, PND, swelling in lower extremities, anasarca,  dizziness, palpitations Resp: No-   shortness of breath with exertion or at rest.              No-   productive cough,  No non-productive cough,  No- coughing up of blood.              No-   change in color of mucus.  No- wheezing.   Skin: No-   rash or lesions. GI:  No-   heartburn, indigestion, abdominal pain, nausea, vomiting,  GU:  MS:  No-   joint pain or swelling.  Neuro-     nothing unusual Psych:  No- change in mood or affect. No depression or anxiety.  No memory loss.  OBJ General- Alert, Oriented, Affect-appropriate, Distress- none acute Skin- rash-none, lesions- none, excoriation- none Lymphadenopathy- none Head- atraumatic            Eyes- Gross vision intact, PERRLA, conjunctivae clear secretions            Ears-  Hearing, canals-normal            Nose- + mucus bridging, no-Septal dev,  polyps, erosion, perforation             Throat- Mallampati II , mucosa clear , drainage- none, tonsils- atrophic Neck- flexible , trachea midline, no stridor , thyroid nl, carotid no bruit Chest - symmetrical excursion , unlabored           Heart/CV- RRR , no murmur , no gallop  , no rub, nl s1 s2                           - JVD- none , edema- none, stasis changes- none, varices- none           Lung- clear to P&A, wheeze- none, cough- none , dullness-none, rub- none           Chest wall-  Abd-  Br/ Gen/ Rectal- Not done, not indicated Extrem- cyanosis- none, clubbing, none, atrophy- none, strength- nl Neuro- grossly intact to observation

## 2012-09-13 NOTE — Assessment & Plan Note (Signed)
Plan-she chooses to continue allergy vaccine. We are renewing EpiPen.

## 2013-01-12 ENCOUNTER — Ambulatory Visit (INDEPENDENT_AMBULATORY_CARE_PROVIDER_SITE_OTHER): Payer: BC Managed Care – PPO

## 2013-01-12 DIAGNOSIS — J309 Allergic rhinitis, unspecified: Secondary | ICD-10-CM

## 2013-01-26 ENCOUNTER — Other Ambulatory Visit: Payer: Self-pay

## 2013-01-26 DIAGNOSIS — Z1231 Encounter for screening mammogram for malignant neoplasm of breast: Secondary | ICD-10-CM

## 2013-03-02 ENCOUNTER — Ambulatory Visit
Admission: RE | Admit: 2013-03-02 | Discharge: 2013-03-02 | Disposition: A | Discharge status: Home / Self Care | Payer: BC Managed Care – PPO | Source: Ambulatory Visit

## 2013-03-02 DIAGNOSIS — Z1231 Encounter for screening mammogram for malignant neoplasm of breast: Secondary | ICD-10-CM

## 2013-08-10 ENCOUNTER — Ambulatory Visit (INDEPENDENT_AMBULATORY_CARE_PROVIDER_SITE_OTHER): Payer: BC Managed Care – PPO

## 2013-08-10 DIAGNOSIS — J309 Allergic rhinitis, unspecified: Secondary | ICD-10-CM

## 2013-10-19 ENCOUNTER — Encounter (INDEPENDENT_AMBULATORY_CARE_PROVIDER_SITE_OTHER): Payer: Self-pay

## 2013-10-19 ENCOUNTER — Encounter: Payer: Self-pay | Admitting: Internal Medicine

## 2013-10-19 ENCOUNTER — Ambulatory Visit (INDEPENDENT_AMBULATORY_CARE_PROVIDER_SITE_OTHER): Payer: BC Managed Care – PPO | Admitting: Internal Medicine

## 2013-10-19 VITALS — BP 100/62 | HR 84 | Ht 60.0 in | Wt 109.6 lb

## 2013-10-19 DIAGNOSIS — J3089 Other allergic rhinitis: Principal | ICD-10-CM

## 2013-10-19 DIAGNOSIS — J209 Acute bronchitis, unspecified: Secondary | ICD-10-CM

## 2013-10-19 DIAGNOSIS — J309 Allergic rhinitis, unspecified: Secondary | ICD-10-CM

## 2013-10-19 DIAGNOSIS — J302 Other seasonal allergic rhinitis: Secondary | ICD-10-CM

## 2013-10-19 MED ORDER — AZITHROMYCIN 250 MG PO TABS: ORAL_TABLET | ORAL | Status: AC

## 2013-10-19 MED ORDER — EPINEPHRINE 0.3 MG/0.3ML IJ SOAJ: INTRAMUSCULAR | Status: DC

## 2013-10-19 NOTE — Patient Instructions (Signed)
Refill for Z pak to hold  Refill for Epipen in case of severe allergic reaction  I will have your paper chart pulled for allergy skin test record

## 2013-10-19 NOTE — Progress Notes (Signed)
09/14/11- 45 yoF never smoker followed for allergic rhinitis, asthmatic bronchitis LOV-Feb 01 2010 She declines flu vaccine. She continues allergy vaccine and strongly believes that it continues to help her. If she is late for a dose she notices progressive nasal and chest congestion. No acute respiratory problems recently. She has not needed a rescue inhaler. 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  09/01/12- 46 yoF never smoker followed for allergic rhinitis, asthmatic bronchitis FOLL[place] FOR: [place] Still on vaccine and no complaints. Specifically with no respiratory or allergy complaints. She declines flu vaccine. Occasionally uses Neti pot and antihistamine She wants to continue allergy vaccine (1:10 GO) after discussing risks, goals and duration of therapy.  10/19/13- 47 yoF never smoker followed for allergic rhinitis, asthmatic bronchitis FOLLOWS FOR:  Still on Allergy vaccine 1:10 GO and doing great--no complaints today She feels she is doing "great" continuing allergy vaccine every other week without problems.  ROS-see HPI Constitutional:   No-   weight loss, night sweats, fevers, chills, fatigue, lassitude. HEENT:   No-  headaches, difficulty swallowing, tooth/dental problems, sore throat,       No-  sneezing, itching, ear ache, nasal congestion, post nasal drip,  CV:  No-   chest pain, orthopnea, PND, swelling in lower extremities, anasarca,  dizziness, palpitations Resp: No-   shortness of breath with exertion or at rest.              No-   productive cough,  No non-productive cough,  No- coughing up of blood.              No-   change in color of mucus.  No- wheezing.   Skin: No-   rash or lesions. GI:  No-   heartburn, indigestion, abdominal pain, nausea, vomiting,  GU:  MS:  No-   joint pain or swelling.  Neuro-     nothing unusual Psych:  No- change in mood or affect. No depression or anxiety.  No memory  loss.  OBJ General- Alert, Oriented, Affect-appropriate, Distress- none acute,  Looks healthy and           comfortable Skin- rash-none, lesions- none, excoriation- none Lymphadenopathy- none Head- atraumatic            Eyes- Gross vision intact, PERRLA, conjunctivae clear secretions            Ears- Hearing, canals-normal            Nose-  clear, no-Septal dev,  polyps, erosion, perforation             Throat- Mallampati II , mucosa clear , drainage- none, tonsils- atrophic Neck- flexible , trachea midline, no stridor , thyroid nl, carotid no bruit Chest - symmetrical excursion , unlabored           Heart/CV- RRR , no murmur , no gallop  , no rub, nl s1 s2                           - JVD- none , edema- none, stasis changes- none, varices- none           Lung- clear to P&A, wheeze- none, cough- none , dullness-none, rub- none           Chest wall-  Abd-  Br/ Gen/ Rectal- Not done, not indicated Extrem- cyanosis- none, clubbing, none, atrophy- none, strength- nl Neuro- grossly intact to observation

## 2013-11-12 ENCOUNTER — Encounter: Payer: Self-pay | Admitting: Internal Medicine

## 2013-11-12 NOTE — Assessment & Plan Note (Signed)
Well controlled  plan- she requests Z-Pak prescription to hold

## 2013-11-12 NOTE — Assessment & Plan Note (Addendum)
We can continue allergy vaccine. She is confident that it helps her. Seasonal antihistamines as needed.  plan-we will check paper chart for old skin test report

## 2013-11-13 ENCOUNTER — Ambulatory Visit: Payer: BC Managed Care – PPO | Admitting: Internal Medicine

## 2014-01-12 ENCOUNTER — Other Ambulatory Visit: Payer: Self-pay

## 2014-01-12 DIAGNOSIS — Z1231 Encounter for screening mammogram for malignant neoplasm of breast: Secondary | ICD-10-CM

## 2014-03-09 ENCOUNTER — Encounter (INDEPENDENT_AMBULATORY_CARE_PROVIDER_SITE_OTHER): Payer: Self-pay

## 2014-03-09 ENCOUNTER — Ambulatory Visit
Admission: RE | Admit: 2014-03-09 | Discharge: 2014-03-09 | Disposition: A | Discharge status: Home / Self Care | Payer: 59 | Source: Ambulatory Visit

## 2014-03-09 DIAGNOSIS — Z1231 Encounter for screening mammogram for malignant neoplasm of breast: Secondary | ICD-10-CM

## 2014-03-23 ENCOUNTER — Other Ambulatory Visit: Payer: Self-pay | Admitting: Family Medicine

## 2014-03-23 ENCOUNTER — Other Ambulatory Visit (HOSPITAL_COMMUNITY)
Admission: RE | Admit: 2014-03-23 | Discharge: 2014-03-23 | Disposition: A | Discharge status: Home / Self Care | Payer: 59 | Source: Ambulatory Visit | Attending: Family Medicine | Admitting: Family Medicine

## 2014-03-23 DIAGNOSIS — Z124 Encounter for screening for malignant neoplasm of cervix: Secondary | ICD-10-CM | POA: Insufficient documentation

## 2014-03-25 LAB — CYTOLOGY - PAP

## 2014-06-01 ENCOUNTER — Ambulatory Visit (INDEPENDENT_AMBULATORY_CARE_PROVIDER_SITE_OTHER): Payer: 59

## 2014-06-01 DIAGNOSIS — J309 Allergic rhinitis, unspecified: Secondary | ICD-10-CM

## 2014-06-09 ENCOUNTER — Telehealth: Payer: Self-pay | Admitting: Internal Medicine

## 2014-06-09 NOTE — Telephone Encounter (Signed)
Erika Wagner ° °

## 2014-07-21 ENCOUNTER — Encounter: Payer: Self-pay | Admitting: Internal Medicine

## 2014-10-25 ENCOUNTER — Ambulatory Visit (INDEPENDENT_AMBULATORY_CARE_PROVIDER_SITE_OTHER): Payer: 59

## 2014-10-25 DIAGNOSIS — J309 Allergic rhinitis, unspecified: Secondary | ICD-10-CM

## 2015-01-31 ENCOUNTER — Other Ambulatory Visit: Payer: Self-pay

## 2015-01-31 DIAGNOSIS — Z1231 Encounter for screening mammogram for malignant neoplasm of breast: Secondary | ICD-10-CM

## 2015-03-16 ENCOUNTER — Ambulatory Visit
Admission: RE | Admit: 2015-03-16 | Discharge: 2015-03-16 | Disposition: A | Discharge status: Home / Self Care | Payer: Commercial Managed Care - HMO | Source: Ambulatory Visit

## 2015-03-16 DIAGNOSIS — Z1231 Encounter for screening mammogram for malignant neoplasm of breast: Secondary | ICD-10-CM

## 2015-05-06 ENCOUNTER — Encounter: Payer: Self-pay | Admitting: Internal Medicine

## 2015-08-02 ENCOUNTER — Ambulatory Visit (INDEPENDENT_AMBULATORY_CARE_PROVIDER_SITE_OTHER): Payer: Commercial Managed Care - HMO | Admitting: Internal Medicine

## 2015-08-02 ENCOUNTER — Encounter: Payer: Self-pay | Admitting: Internal Medicine

## 2015-08-02 VITALS — BP 110/60 | HR 90 | Ht 60.0 in | Wt 111.6 lb

## 2015-08-02 DIAGNOSIS — J3089 Other allergic rhinitis: Principal | ICD-10-CM

## 2015-08-02 DIAGNOSIS — J309 Allergic rhinitis, unspecified: Secondary | ICD-10-CM

## 2015-08-02 DIAGNOSIS — J302 Other seasonal allergic rhinitis: Secondary | ICD-10-CM

## 2015-08-02 NOTE — Patient Instructions (Signed)
Ok to continue Allergy Vaccine   Please call if we can help

## 2015-08-02 NOTE — Assessment & Plan Note (Signed)
Okay to continue allergy vaccine every other week for another year. We made recommend stopping at that time. I suggested she might ask her primary physician [facility] if her ear really bothers her and nobody sees anything abnormal.

## 2015-08-02 NOTE — Progress Notes (Signed)
09/14/11- 45 yoF never smoker followed for allergic rhinitis, asthmatic bronchitis LOV-Feb 01 2010 She declines flu vaccine. She continues allergy vaccine and strongly believes that it continues to help her. If she is late for a dose she notices progressive nasal and chest congestion. No acute respiratory problems recently. She has not needed a rescue inhaler. 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  09/01/12- 46 yoF never smoker followed for allergic rhinitis, asthmatic bronchitis FOLL[place] FOR: [place] Still on vaccine and no complaints. Specifically with no respiratory or allergy complaints. She declines flu vaccine. Occasionally uses Neti pot and antihistamine She wants to continue allergy vaccine (1:10 GO) after discussing risks, goals and duration of therapy.  10/19/13- 47 yoF never smoker followed for allergic rhinitis, asthmatic bronchitis FOLLOWS FOR:  Still on Allergy vaccine 1:10 GO and doing great--no complaints today She feels she is doing "great" continuing allergy vaccine every other week without problems.  08/02/2015-49 year old female never smoker followed for allergic rhinitis, asthmatic bronchitis Allergy vaccine 1:10 GO FOLLOWS FOR: Pt continues allergy vaccine every 2 weeks at home. Pt states she did not get her "usual" sinus infection this year. Recess 5 with her status this year. Declines flu vaccine. Feels allergy vaccine continues worthwhile. Had a cold several weeks ago and since then has had hyper acuity in left ear for which she has had evaluation by 2 primary physician's and to ENT physicians with no findings. She denies headache, sinus pain or pressure, tooth discomfort.  ROS-see HPI Constitutional:   No-   weight loss, night sweats, fevers, chills, fatigue, lassitude. HEENT:   No-  headaches, difficulty swallowing, tooth/dental problems, sore throat,       No-  sneezing, itching, ear ache, nasal  congestion, post nasal drip,  CV:  No-   chest pain, orthopnea, PND, swelling in lower extremities, anasarca,  dizziness, palpitations Resp: No-   shortness of breath with exertion or at rest.              No-   productive cough,  No non-productive cough,  No- coughing up of blood.              No-   change in color of mucus.  No- wheezing.   Skin: No-   rash or lesions. GI:  No-   heartburn, indigestion, abdominal pain, nausea, vomiting,  GU:  MS:  No-   joint pain or swelling.  Neuro-     nothing unusual Psych:  No- change in mood or affect. No depression or anxiety.  No memory loss.  OBJ General- Alert, Oriented, Affect-appropriate, Distress- none acute,  Looks healthy and comfortable Skin- rash-none, lesions- none, excoriation- none Lymphadenopathy- none Head- atraumatic            Eyes- Gross vision intact, PERRLA, conjunctivae clear secretions            Ears- Hearing, canals-normal            Nose-  clear, no-Septal dev,  polyps, erosion, perforation             Throat- Mallampati II , mucosa clear , drainage- none, tonsils- atrophic Neck- flexible , trachea midline, no stridor , thyroid nl, carotid no bruit Chest - symmetrical excursion , unlabored           Heart/CV- RRR , no murmur , no gallop  , no rub, nl s1 s2                           -  JVD- none , edema- none, stasis changes- none, varices- none           Lung- clear to P&A, wheeze- none, cough- none , dullness-none, rub- none           Chest wall-  Abd-  Br/ Gen/ Rectal- Not done, not indicated Extrem- cyanosis- none, clubbing, none, atrophy- none, strength- nl Neuro- grossly intact to observation

## 2015-09-15 ENCOUNTER — Telehealth: Payer: Self-pay | Admitting: Internal Medicine

## 2015-09-15 MED ORDER — BENZONATATE 200 MG PO CAPS: 200.0000 mg | ORAL_CAPSULE | Freq: Three times a day (TID) | ORAL | Status: DC | PRN

## 2015-09-15 MED ORDER — DOXYCYCLINE HYCLATE 100 MG PO TABS: ORAL_TABLET | ORAL | Status: DC

## 2015-09-15 NOTE — Telephone Encounter (Signed)
Spoke with pt and is aware of recs. RX's sent in. Nothing further needed 

## 2015-09-15 NOTE — Telephone Encounter (Signed)
Pt IN LOBBY to speak to nurse

## 2015-09-15 NOTE — Telephone Encounter (Signed)
LVM for patient to return call. 

## 2015-09-15 NOTE — Telephone Encounter (Signed)
Offer doxycycline 100 mg, # 8, 2 today then one daily           Benzonatate perles 200 mg, # 30, 1 every 8 hours if needed for cough

## 2015-09-15 NOTE — Telephone Encounter (Signed)
Patient stopped back by the office and states to now she can be reached on her house phone [phone].

## 2015-09-15 NOTE — Telephone Encounter (Signed)
Called spoke with pt. She c/o PND, persistent nagging dry cough x couple weeks now. No f/c/s/n/v. She is doing saline nasal rinses and taking clairitin.  Please advise Dr. Maple HudsonYoung thanks  Allergies  Allergen Reactions  . Clindamycin     GI upset; nausea  . E-Mycin [Erythromycin Base] Nausea Only    Gi upset     Current Outpatient Prescriptions on File Prior to Visit  Medication Sig Dispense Refill  . EPINEPHrine (EPI-PEN) 0.3 mg/0.3 mL SOAJ injection Inject into thigh if needed for severe allergic reaction (Patient not taking: Reported on 08/02/2015) 1 Device prn  . loratadine (CLARITIN) 10 MG tablet Take 10 mg by mouth daily as needed for allergies.    . Misc Natural Products (GLUCOSAMINE CHOND COMPLEX/MSM PO) Take 2 tablets by mouth daily.    . Multiple Vitamin (MULTIVITAMIN) tablet Take 1 tablet by mouth daily.    . NONFORMULARY OR COMPOUNDED ITEM Allergy Vaccine 1:10 Given at Home     No current facility-administered medications on file prior to visit.

## 2015-10-05 ENCOUNTER — Telehealth: Payer: Self-pay | Admitting: Internal Medicine

## 2015-10-05 NOTE — Telephone Encounter (Signed)
Allergy Serum Extract Date Mixed: 10/05/15 Vial: 2 Strength: 1:10 Here/Mail/Pick Up: mail Mixed By: tbs Last OV: 08/02/15 Pending OV: n/a

## 2015-10-07 DIAGNOSIS — J309 Allergic rhinitis, unspecified: Secondary | ICD-10-CM | POA: Diagnosis not present

## 2015-10-31 ENCOUNTER — Telehealth: Payer: Self-pay | Admitting: Internal Medicine

## 2015-10-31 DIAGNOSIS — J309 Allergic rhinitis, unspecified: Secondary | ICD-10-CM | POA: Diagnosis not present

## 2015-10-31 NOTE — Telephone Encounter (Addendum)
Allergy Serum Extract Date Mixed: 10/31/15 Vial: 1 Pt. Broke Vial A. Strength: 1:10 Here/Mail/Pick Up: here Mixed By: tbs Last OV: 08/02/15 Pending OV: n/a

## 2015-12-22 ENCOUNTER — Other Ambulatory Visit: Payer: Self-pay

## 2015-12-22 DIAGNOSIS — Z1231 Encounter for screening mammogram for malignant neoplasm of breast: Secondary | ICD-10-CM

## 2016-03-19 ENCOUNTER — Ambulatory Visit: Payer: Commercial Managed Care - HMO

## 2016-04-03 ENCOUNTER — Other Ambulatory Visit: Payer: Self-pay | Admitting: Physician Assistant

## 2016-04-03 ENCOUNTER — Ambulatory Visit
Admission: RE | Admit: 2016-04-03 | Discharge: 2016-04-03 | Disposition: A | Discharge status: Home / Self Care | Payer: Commercial Managed Care - HMO | Source: Ambulatory Visit

## 2016-04-03 DIAGNOSIS — Z1231 Encounter for screening mammogram for malignant neoplasm of breast: Secondary | ICD-10-CM

## 2016-06-15 ENCOUNTER — Telehealth: Payer: Self-pay | Admitting: Internal Medicine

## 2016-06-15 DIAGNOSIS — J309 Allergic rhinitis, unspecified: Secondary | ICD-10-CM | POA: Diagnosis not present

## 2016-06-15 NOTE — Telephone Encounter (Signed)
Allergy Serum Extract Date Mixed: 06/15/16 Vial: 2 Strength: 1:10 Here/Mail/Pick Up: mail Mixed By: tbs Last OV: 08/02/15 Pending OV: N/A

## 2016-07-26 ENCOUNTER — Encounter: Payer: Self-pay | Admitting: Internal Medicine

## 2016-07-26 MED ORDER — AZITHROMYCIN 250 MG PO TABS: ORAL_TABLET | ORAL | 0 refills | Status: AC

## 2016-07-26 NOTE — Addendum Note (Signed)
Addended by: Jaynee EaglesLEMONS, LINDSAY C on: 07/26/2016 01:53 PM   Modules accepted: Orders

## 2016-07-26 NOTE — Telephone Encounter (Signed)
Pt calling wanting to know if CY can call in something gor the sinus infection,please advise pt can be reached @ [phone].Caren GriffinsStanley A Dalton

## 2016-07-26 NOTE — Telephone Encounter (Signed)
CY - please advise. Thanks! 

## 2016-07-26 NOTE — Telephone Encounter (Signed)
Offer Zpak  # 6, 2 today then 1 daily, with one refill. If that doesn't take care of it, ask her to let us know.

## 2016-07-26 NOTE — Telephone Encounter (Signed)
Spoke with pt on the phone. She is aware of CY's recommendation. Rx has been sent in. Nothing further was needed.

## 2016-12-20 DIAGNOSIS — D1800 Hemangioma unspecified site: Secondary | ICD-10-CM | POA: Diagnosis not present

## 2016-12-20 DIAGNOSIS — D2262 Melanocytic nevi of left upper limb, including shoulder: Secondary | ICD-10-CM | POA: Diagnosis not present

## 2016-12-20 DIAGNOSIS — L821 Other seborrheic keratosis: Secondary | ICD-10-CM | POA: Diagnosis not present

## 2016-12-20 DIAGNOSIS — D225 Melanocytic nevi of trunk: Secondary | ICD-10-CM | POA: Diagnosis not present

## 2017-02-18 ENCOUNTER — Other Ambulatory Visit: Payer: Self-pay | Admitting: Family Medicine

## 2017-02-18 DIAGNOSIS — Z1231 Encounter for screening mammogram for malignant neoplasm of breast: Secondary | ICD-10-CM

## 2017-04-04 ENCOUNTER — Ambulatory Visit
Admission: RE | Admit: 2017-04-04 | Discharge: 2017-04-04 | Disposition: A | Discharge status: Home / Self Care | Payer: BLUE CROSS/BLUE SHIELD | Source: Ambulatory Visit | Attending: Family Medicine | Admitting: Family Medicine

## 2017-04-04 DIAGNOSIS — K080 Exfoliation of teeth due to systemic causes: Secondary | ICD-10-CM | POA: Diagnosis not present

## 2017-04-04 DIAGNOSIS — Z0000 Encounter for general adult medical examination without abnormal findings: Secondary | ICD-10-CM | POA: Diagnosis not present

## 2017-04-04 DIAGNOSIS — Z1231 Encounter for screening mammogram for malignant neoplasm of breast: Secondary | ICD-10-CM

## 2017-04-12 ENCOUNTER — Other Ambulatory Visit (HOSPITAL_COMMUNITY)
Admission: RE | Admit: 2017-04-12 | Discharge: 2017-04-12 | Disposition: A | Discharge status: Home / Self Care | Payer: BLUE CROSS/BLUE SHIELD | Source: Ambulatory Visit | Attending: Physician Assistant | Admitting: Physician Assistant

## 2017-04-12 ENCOUNTER — Other Ambulatory Visit: Payer: Self-pay | Admitting: Physician Assistant

## 2017-04-12 DIAGNOSIS — Z124 Encounter for screening for malignant neoplasm of cervix: Secondary | ICD-10-CM | POA: Diagnosis not present

## 2017-04-12 DIAGNOSIS — Z0000 Encounter for general adult medical examination without abnormal findings: Secondary | ICD-10-CM | POA: Diagnosis not present

## 2017-04-12 DIAGNOSIS — Z13220 Encounter for screening for lipoid disorders: Secondary | ICD-10-CM | POA: Diagnosis not present

## 2017-04-16 LAB — CYTOLOGY - PAP
Diagnosis: NEGATIVE
HPV: NOT DETECTED

## 2017-04-26 DIAGNOSIS — Z1211 Encounter for screening for malignant neoplasm of colon: Secondary | ICD-10-CM | POA: Diagnosis not present

## 2017-10-07 ENCOUNTER — Encounter: Payer: Self-pay | Admitting: Allergy and Immunology

## 2017-10-07 ENCOUNTER — Ambulatory Visit (INDEPENDENT_AMBULATORY_CARE_PROVIDER_SITE_OTHER): Payer: BLUE CROSS/BLUE SHIELD | Admitting: Allergy and Immunology

## 2017-10-07 DIAGNOSIS — J310 Chronic rhinitis: Secondary | ICD-10-CM | POA: Diagnosis not present

## 2017-10-07 MED ORDER — CARBINOXAMINE MALEATE 6 MG PO TABS: 6.0000 mg | ORAL_TABLET | Freq: Two times a day (BID) | ORAL | 3 refills | Status: DC | PRN

## 2017-10-07 MED ORDER — AZELASTINE HCL 0.15 % NA SOLN: 1.0000 | Freq: Two times a day (BID) | NASAL | 5 refills | Status: DC | PRN

## 2017-10-07 NOTE — Patient Instructions (Addendum)
Chronic rhinitis All seasonal and perennial aeroallergen skin tests are negative despite a positive histamine control.  Intranasal steroids, intranasal antihistamines, and first generation antihistamines are effective for symptoms associated with non-allergic rhinitis, whereas second generation antihistamines such as cetirizine (Zyrtec), loratadine (Claritin) and fexofenadine (Allegra) have been found to be ineffective for this condition.  A prescription has been provided for azelastine nasal spray, one spray per nostril 1-2 times daily as needed. Proper nasal spray technique has been discussed and demonstrated.  Nasal saline lavage (NeilMed) has been recommended as needed and prior to medicated nasal sprays along with instructions for proper administration.  A prescription has been provided for RyVent (carbinoxamine maleate) 6mg  every 6-8 hours as needed.   Return if symptoms worsen or fail to improve.

## 2017-10-07 NOTE — Assessment & Plan Note (Signed)
All seasonal and perennial aeroallergen skin tests are negative despite a positive histamine control.  Intranasal steroids, intranasal antihistamines, and first generation antihistamines are effective for symptoms associated with non-allergic rhinitis, whereas second generation antihistamines such as cetirizine (Zyrtec), loratadine (Claritin) and fexofenadine (Allegra) have been found to be ineffective for this condition.  A prescription has been provided for azelastine nasal spray, one spray per nostril 1-2 times daily as needed. Proper nasal spray technique has been discussed and demonstrated.  Nasal saline lavage (NeilMed) has been recommended as needed and prior to medicated nasal sprays along with instructions for proper administration.  A prescription has been provided for RyVent (carbinoxamine maleate) 6mg every 6-8 hours as needed. 

## 2017-10-07 NOTE — Progress Notes (Signed)
New Patient Note  RE: Erika Wagner MRN: [id number] DOB: 01/30/1966 Date of Office Visit: 10/07/2017  Referring provider: Juluis RainierBarnes, Elizabeth, MD Primary care provider: Juluis RainierBarnes, Elizabeth, MD  Chief Complaint: Nasal Congestion and Sinus Problem   History of present illness: Erika FordSonja Wagner is a 52 y.o. female seen today in consultation requested by Juluis RainierElizabeth Barnes, MD. She is a former patient of Dr. Jobie Quakerlinton Youngs, here today to transfer her care to our offices.  She reports that she had been on aeroallergen immunotherapy with significant benefit.  She discontinued immunotherapy after approximately 17 years.  She states that her symptoms remained well controlled until this past year when she began to experience thick postnasal drainage, "raw" throat, hoarseness, sinus pressure, and sneezing.  She is continues to experience persistent thick postnasal drainage.  Last week she states that she had coughed almost to the point of vomiting.  Loratadine seemed to make her symptoms worse.  No significant seasonal symptom variation has been noted nor have specific environmental triggers been identified.   Assessment and plan: Chronic rhinitis All seasonal and perennial aeroallergen skin tests are negative despite a positive histamine control.  Intranasal steroids, intranasal antihistamines, and first generation antihistamines are effective for symptoms associated with non-allergic rhinitis, whereas second generation antihistamines such as cetirizine (Zyrtec), loratadine (Claritin) and fexofenadine (Allegra) have been found to be ineffective for this condition.  A prescription has been provided for azelastine nasal spray, one spray per nostril 1-2 times daily as needed. Proper nasal spray technique has been discussed and demonstrated.  Nasal saline lavage (NeilMed) has been recommended as needed and prior to medicated nasal sprays along with instructions for proper administration.  A prescription has been  provided for RyVent (carbinoxamine maleate) 6mg  every 6-8 hours as needed.   Meds ordered this encounter  Medications  . Carbinoxamine Maleate (RYVENT) 6 MG TABS    Sig: Take 6 mg by mouth 2 (two) times daily as needed.    Dispense:  60 tablet    Refill:  3    Patient has coupon  . Azelastine HCl 0.15 % SOLN    Sig: Place 1 spray into both nostrils 2 (two) times daily as needed.    Dispense:  30 mL    Refill:  5    Diagnostics: Epicutaneous testing: Negative despite a positive histamine control. Intradermal testing: Negative despite a positive histamine control.    Physical examination: Blood pressure 110/70, pulse 72, height 5' (1.524 m), weight 123 lb 6.4 oz (56 kg).  General: Alert, interactive, in no acute distress. HEENT: TMs pearly gray, turbinates moderately edematous without discharge, post-pharynx moderately erythematous. Neck: Supple without lymphadenopathy. Lungs: Clear to auscultation without wheezing, rhonchi or rales. CV: Normal S1, S2 without murmurs. Abdomen: Nondistended, nontender. Skin: Warm and dry, without lesions or rashes. Extremities:  No clubbing, cyanosis or edema. Neuro:   Grossly intact.  Review of systems:  Review of systems negative except as noted in HPI / PMHx or noted below: Review of Systems  Constitutional: Negative.   HENT: Negative.   Eyes: Negative.   Respiratory: Negative.   Cardiovascular: Negative.   Gastrointestinal: Negative.   Genitourinary: Negative.   Musculoskeletal: Negative.   Skin: Negative.   Neurological: Negative.   Endo/Heme/Allergies: Negative.   Psychiatric/Behavioral: Negative.     Past medical history:  Past Medical History:  Diagnosis Date  . Allergy   . Asthma     Past surgical history:  Past Surgical History:  Procedure Laterality Date  .  CORNEAL TRANSPLANT     Implants  . EYE SURGERY     Corneal rings removed from left eye  . KNEE SURGERY     2 knee surgeries  . TUBAL LIGATION       Family history: Family History  Problem Relation Age of Onset  . Heart attack Father        dscd  . Heart disease Father 65       MI  . Hypertension Father   . Hyperlipidemia Father   . Diabetes Mother   . Hypertension Mother   . Hyperlipidemia Mother   . Breast cancer Maternal Aunt     Social history: Social History   Socioeconomic History  . Marital status: Married    Spouse name: Not on file  . Number of children: Not on file  . Years of education: Not on file  . Highest education level: Not on file  Social Needs  . Financial resource strain: Not on file  . Food insecurity - worry: Not on file  . Food insecurity - inability: Not on file  . Transportation needs - medical: Not on file  . Transportation needs - non-medical: Not on file  Occupational History  . Occupation: housewife    Employer: UNEMPLOYED  Tobacco Use  . Smoking status: Never Smoker  . Smokeless tobacco: Never Used  Substance and Sexual Activity  . Alcohol use: No  . Drug use: No  . Sexual activity: Yes    Partners: Male  Other Topics Concern  . Not on file  Social History Narrative  . Not on file   Environmental History: The patient lives in a 51 year old house with hardwood floors throughout, gas heat, and central air.  There are 2 cats in the home which have access to her bedroom.  She is a non-smoker.  There is no known mold/water damage in the home.  Allergies as of 10/07/2017      Reactions   Clindamycin    GI upset; nausea   E-mycin [erythromycin Base] Nausea Only   Gi upset      Medication List        Accurate as of 10/07/17  7:31 PM. Always use your most recent med list.          Azelastine HCl 0.15 % Soln Place 1 spray into both nostrils 2 (two) times daily as needed.   Carbinoxamine Maleate 6 MG Tabs Commonly known as:  RYVENT Take 6 mg by mouth 2 (two) times daily as needed.   EPINEPHrine 0.3 mg/0.3 mL Soaj injection Commonly known as:  EPI-PEN Inject into thigh  if needed for severe allergic reaction   GLUCOSAMINE CHOND COMPLEX/MSM PO Take 2 tablets by mouth daily.   loratadine 10 MG tablet Commonly known as:  CLARITIN Take 10 mg by mouth daily as needed for allergies.   multivitamin tablet Take 1 tablet by mouth daily.       Known medication allergies: Allergies  Allergen Reactions  . Clindamycin     GI upset; nausea  . E-Mycin [Erythromycin Base] Nausea Only    Gi upset    I appreciate the opportunity to take part in Erika Wagner's care. Please do not hesitate to contact me with questions.  Sincerely,   R. Jorene Guest, MD

## 2017-12-25 DIAGNOSIS — D1800 Hemangioma unspecified site: Secondary | ICD-10-CM | POA: Diagnosis not present

## 2017-12-25 DIAGNOSIS — D485 Neoplasm of uncertain behavior of skin: Secondary | ICD-10-CM | POA: Diagnosis not present

## 2017-12-25 DIAGNOSIS — L821 Other seborrheic keratosis: Secondary | ICD-10-CM | POA: Diagnosis not present

## 2017-12-25 DIAGNOSIS — D225 Melanocytic nevi of trunk: Secondary | ICD-10-CM | POA: Diagnosis not present

## 2017-12-25 DIAGNOSIS — D2271 Melanocytic nevi of right lower limb, including hip: Secondary | ICD-10-CM | POA: Diagnosis not present

## 2018-02-06 DIAGNOSIS — J069 Acute upper respiratory infection, unspecified: Secondary | ICD-10-CM | POA: Diagnosis not present

## 2018-02-18 DIAGNOSIS — J0100 Acute maxillary sinusitis, unspecified: Secondary | ICD-10-CM | POA: Diagnosis not present

## 2018-02-18 DIAGNOSIS — J209 Acute bronchitis, unspecified: Secondary | ICD-10-CM | POA: Diagnosis not present

## 2018-02-24 ENCOUNTER — Other Ambulatory Visit: Payer: Self-pay | Admitting: Family Medicine

## 2018-02-24 DIAGNOSIS — Z1231 Encounter for screening mammogram for malignant neoplasm of breast: Secondary | ICD-10-CM

## 2018-04-10 ENCOUNTER — Ambulatory Visit
Admission: RE | Admit: 2018-04-10 | Discharge: 2018-04-10 | Disposition: A | Discharge status: Home / Self Care | Payer: BLUE CROSS/BLUE SHIELD | Source: Ambulatory Visit | Attending: Family Medicine | Admitting: Family Medicine

## 2018-04-10 DIAGNOSIS — Z1231 Encounter for screening mammogram for malignant neoplasm of breast: Secondary | ICD-10-CM

## 2018-04-10 DIAGNOSIS — E782 Mixed hyperlipidemia: Secondary | ICD-10-CM | POA: Diagnosis not present

## 2018-04-10 DIAGNOSIS — Z0000 Encounter for general adult medical examination without abnormal findings: Secondary | ICD-10-CM | POA: Diagnosis not present

## 2018-12-05 ENCOUNTER — Encounter: Payer: Self-pay | Admitting: Family Medicine

## 2018-12-08 ENCOUNTER — Telehealth: Payer: Self-pay

## 2018-12-08 NOTE — Telephone Encounter (Signed)
Called patient to reschedule her appointment in order to limit Covid_19 exposure. Push appointment out at least 1 month.  Left message for patient to call back. CRM created.

## 2018-12-10 ENCOUNTER — Ambulatory Visit: Payer: Self-pay | Admitting: Family Medicine

## 2019-01-01 ENCOUNTER — Encounter: Payer: Self-pay | Admitting: Family Medicine

## 2019-01-01 ENCOUNTER — Telehealth: Payer: Self-pay | Admitting: *Deleted

## 2019-01-01 NOTE — Telephone Encounter (Signed)
Noted  

## 2019-01-01 NOTE — Telephone Encounter (Signed)
Copied from CRM [id number]. Topic: General - Other >> Dec 31, 2018 10:58 AM Jaquita Rector A wrote: Reason for CRM: Patient called to say that if its ok with Dr Hassan Rowan she doesn't mind doing a virtual visit asking for a call back to discuss this option. Please advise Ph#  [phone]   RN Spoke with patient. Left appointment as is for 4/22. Patient is okay with [facility] appointment.

## 2019-01-07 ENCOUNTER — Other Ambulatory Visit: Payer: Self-pay

## 2019-01-07 ENCOUNTER — Ambulatory Visit (INDEPENDENT_AMBULATORY_CARE_PROVIDER_SITE_OTHER): Payer: Managed Care, Other (non HMO) | Admitting: Family Medicine

## 2019-01-07 ENCOUNTER — Encounter: Payer: Self-pay | Admitting: Family Medicine

## 2019-01-07 DIAGNOSIS — E785 Hyperlipidemia, unspecified: Secondary | ICD-10-CM

## 2019-01-07 DIAGNOSIS — Z1283 Encounter for screening for malignant neoplasm of skin: Secondary | ICD-10-CM
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
## 2019-03-03 ENCOUNTER — Other Ambulatory Visit: Payer: Self-pay | Admitting: Family Medicine

## 2019-03-03 DIAGNOSIS — Z1231 Encounter for screening mammogram for malignant neoplasm of breast: Secondary | ICD-10-CM

## 2019-03-24 ENCOUNTER — Encounter: Payer: Self-pay | Admitting: Family Medicine

## 2019-03-25 NOTE — Telephone Encounter (Signed)
Please see how we can help her get tested sooner? Not sure where her testing is and whether perhaps [employer] would be faster? [phone]) or if there are new testing sites I am not aware of since I was out last week. It might be that so many more people are getting tested now perhaps there is delay?

## 2019-04-16 ENCOUNTER — Encounter: Payer: Self-pay | Admitting: Family Medicine

## 2019-04-16 ENCOUNTER — Ambulatory Visit: Payer: Managed Care, Other (non HMO)

## 2019-04-16 LAB — BASIC METABOLIC PANEL
BUN: 20 (ref 4–21)
Creatinine: 0.9 (ref <=1.1)
Glucose: 84
Potassium: 4.6 (ref 3.4–5.3)
Sodium: 140 (ref 137–147)

## 2019-04-16 LAB — LIPID PANEL
Cholesterol: 196 (ref 0–200)
HDL: 68 (ref 35–70)
LDL Cholesterol: 113
Triglycerides: 79 (ref 40–160)

## 2019-04-16 LAB — CBC AND DIFFERENTIAL
HCT: 38 (ref 36–46)
Hemoglobin: 12.9 (ref 12.0–16.0)
Platelets: 266 (ref 150–399)
WBC: 6.4

## 2019-04-16 LAB — HEPATIC FUNCTION PANEL
ALT: 12 (ref 7–35)
AST: 21 (ref 13–35)
Alkaline Phosphatase: 60 (ref 25–125)

## 2019-05-19 ENCOUNTER — Encounter: Payer: Self-pay | Admitting: Family Medicine

## 2019-05-26 ENCOUNTER — Ambulatory Visit: Payer: Managed Care, Other (non HMO)

## 2019-05-26 ENCOUNTER — Ambulatory Visit
Admission: RE | Admit: 2019-05-26 | Discharge: 2019-05-26 | Disposition: A | Discharge status: Home / Self Care | Payer: Managed Care, Other (non HMO) | Source: Ambulatory Visit | Attending: Family Medicine | Admitting: Family Medicine

## 2019-05-26 ENCOUNTER — Other Ambulatory Visit: Payer: Self-pay | Admitting: Family Medicine

## 2019-05-26 ENCOUNTER — Other Ambulatory Visit: Payer: Self-pay

## 2019-05-26 DIAGNOSIS — Z1231 Encounter for screening mammogram for malignant neoplasm of breast: Secondary | ICD-10-CM

## 2019-07-07 ENCOUNTER — Encounter: Payer: Self-pay | Admitting: Family Medicine

## 2019-10-19 ENCOUNTER — Encounter: Payer: Self-pay | Admitting: Family Medicine

## 2019-10-22 ENCOUNTER — Other Ambulatory Visit: Payer: Self-pay

## 2019-10-23 ENCOUNTER — Ambulatory Visit (INDEPENDENT_AMBULATORY_CARE_PROVIDER_SITE_OTHER): Payer: Managed Care, Other (non HMO)

## 2019-10-23 ENCOUNTER — Encounter: Payer: Self-pay | Admitting: Family Medicine

## 2019-10-23 ENCOUNTER — Ambulatory Visit: Payer: Managed Care, Other (non HMO) | Admitting: Family Medicine

## 2019-10-23 VITALS — BP 100/62 | HR 74 | Temp 97.9°F | Ht 60.0 in | Wt 111.8 lb

## 2019-10-23 DIAGNOSIS — M25562 Pain in left knee: Secondary | ICD-10-CM

## 2019-10-23 NOTE — Addendum Note (Signed)
Addended by: Philemon Kingdom on: 10/23/2019 02:58 PM   Modules accepted: Orders
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
## 2019-10-29 ENCOUNTER — Encounter: Payer: Self-pay | Admitting: Family Medicine

## 2019-10-29 DIAGNOSIS — E2839 Other primary ovarian failure: Secondary | ICD-10-CM

## 2019-10-29 DIAGNOSIS — M85862 Other specified disorders of bone density and structure, left lower leg: Secondary | ICD-10-CM

## 2019-10-30 NOTE — Progress Notes (Signed)
Concerns were addressed through MyChart with the patient.

## 2019-11-10 ENCOUNTER — Encounter: Payer: Self-pay | Admitting: Family Medicine

## 2019-11-13 ENCOUNTER — Ambulatory Visit
Admission: RE | Admit: 2019-11-13 | Discharge: 2019-11-13 | Disposition: A | Discharge status: Home / Self Care | Payer: Managed Care, Other (non HMO) | Source: Ambulatory Visit | Attending: Family Medicine | Admitting: Family Medicine

## 2019-11-13 ENCOUNTER — Other Ambulatory Visit: Payer: Self-pay

## 2019-11-13 DIAGNOSIS — E2839 Other primary ovarian failure: Secondary | ICD-10-CM

## 2019-11-13 DIAGNOSIS — M85862 Other specified disorders of bone density and structure, left lower leg: Secondary | ICD-10-CM

## 2019-11-17 ENCOUNTER — Encounter: Payer: Self-pay | Admitting: Family Medicine

## 2019-11-17 NOTE — Progress Notes (Signed)
Mychart message sent.

## 2019-11-18 ENCOUNTER — Other Ambulatory Visit: Payer: Self-pay | Admitting: Family Medicine

## 2019-11-18 DIAGNOSIS — M25562 Pain in left knee: Secondary | ICD-10-CM

## 2019-11-18 DIAGNOSIS — M810 Age-related osteoporosis without current pathological fracture: Secondary | ICD-10-CM

## 2019-11-18 NOTE — Telephone Encounter (Signed)
Please set up lab visit

## 2019-11-19 ENCOUNTER — Other Ambulatory Visit: Payer: Self-pay

## 2019-11-19 NOTE — Addendum Note (Signed)
Addended by: Philemon Kingdom on: 11/19/2019 03:22 PM   Modules accepted: Orders

## 2019-11-19 NOTE — Addendum Note (Signed)
Addended by: Philemon Kingdom on: 11/19/2019 03:25 PM   Modules accepted: Orders

## 2019-11-19 NOTE — Addendum Note (Signed)
Addended by: Lathyn Griggs on: 11/19/2019 03:25 PM   Modules accepted: Orders  

## 2019-11-20 ENCOUNTER — Encounter: Payer: Self-pay | Admitting: Family Medicine

## 2019-11-20 ENCOUNTER — Other Ambulatory Visit (INDEPENDENT_AMBULATORY_CARE_PROVIDER_SITE_OTHER): Payer: Managed Care, Other (non HMO)

## 2019-11-20 DIAGNOSIS — M810 Age-related osteoporosis without current pathological fracture: Secondary | ICD-10-CM

## 2019-11-20 DIAGNOSIS — E875 Hyperkalemia: Secondary | ICD-10-CM

## 2019-11-20 LAB — COMPREHENSIVE METABOLIC PANEL
ALT: 13 U/L (ref 0–35)
AST: 20 U/L (ref 0–37)
Albumin: 4.4 g/dL (ref 3.5–5.2)
Alkaline Phosphatase: 70 U/L (ref 39–117)
BUN: 26 mg/dL — ABNORMAL HIGH (ref 6–23)
CO2: 30 mEq/L (ref 19–32)
Calcium: 10 mg/dL (ref 8.4–10.5)
Chloride: 100 mEq/L (ref 96–112)
Creatinine, Ser: 0.81 mg/dL (ref 0.40–1.20)
GFR: 73.96 mL/min (ref >60.00)
Glucose, Bld: 89 mg/dL (ref 70–99)
Potassium: 5.4 mEq/L — ABNORMAL HIGH (ref 3.5–5.1)
Sodium: 137 mEq/L (ref 135–145)
Total Bilirubin: 0.4 mg/dL (ref 0.2–1.2)
Total Protein: 7.8 g/dL (ref 6.0–8.3)

## 2019-11-20 LAB — PHOSPHORUS: Phosphorus: 3.2 mg/dL (ref 2.3–4.6)

## 2019-11-20 LAB — CBC WITH DIFFERENTIAL/PLATELET
Basophils Absolute: 0 10*3/uL (ref 0.0–0.1)
Basophils Relative: 0.6 % (ref 0.0–3.0)
Eosinophils Absolute: 0.1 10*3/uL (ref 0.0–0.7)
Eosinophils Relative: 2.2 % (ref 0.0–5.0)
HCT: 39 % (ref 36.0–46.0)
Hemoglobin: 13.3 g/dL (ref 12.0–15.0)
Lymphocytes Relative: 32.4 % (ref 12.0–46.0)
Lymphs Abs: 1.8 10*3/uL (ref 0.7–4.0)
MCHC: 34 g/dL (ref 30.0–36.0)
MCV: 92.8 fl (ref 78.0–100.0)
Monocytes Absolute: 0.5 10*3/uL (ref 0.1–1.0)
Monocytes Relative: 8 % (ref 3.0–12.0)
Neutro Abs: 3.2 10*3/uL (ref 1.4–7.7)
Neutrophils Relative %: 56.8 % (ref 43.0–77.0)
Platelets: 271 10*3/uL (ref 150.0–400.0)
RBC: 4.2 Mil/uL (ref 3.87–5.11)
RDW: 13 % (ref 11.5–15.5)
WBC: 5.7 10*3/uL (ref 4.0–10.5)

## 2019-11-20 LAB — TSH: TSH: 2 u[IU]/mL (ref 0.35–4.50)

## 2019-11-20 LAB — VITAMIN D 25 HYDROXY (VIT D DEFICIENCY, FRACTURES): VITD: 55.96 ng/mL (ref 30.00–100.00)

## 2019-11-23 ENCOUNTER — Encounter: Payer: Self-pay | Admitting: Family Medicine

## 2019-11-23 ENCOUNTER — Other Ambulatory Visit: Payer: Self-pay

## 2019-11-23 ENCOUNTER — Ambulatory Visit (INDEPENDENT_AMBULATORY_CARE_PROVIDER_SITE_OTHER): Payer: Managed Care, Other (non HMO) | Admitting: Family Medicine

## 2019-11-23 DIAGNOSIS — M810 Age-related osteoporosis without current pathological fracture: Secondary | ICD-10-CM | POA: Diagnosis not present

## 2019-11-23 DIAGNOSIS — M25562 Pain in left knee: Secondary | ICD-10-CM

## 2019-11-23 LAB — MAGNESIUM, RBC: Magnesium RBC: 5.3 mg/dL (ref 4.0–6.4)

## 2019-11-23 LAB — PTH, INTACT AND CALCIUM
Calcium: 10.3 mg/dL (ref 8.6–10.4)
PTH: 14 pg/mL (ref 14–64)
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
## 2019-11-24 LAB — CALCIUM, URINE, 24 HOUR: Calcium, 24H Urine: 287 mg/24 h — ABNORMAL HIGH

## 2019-12-04 ENCOUNTER — Encounter: Payer: Self-pay | Admitting: Family Medicine

## 2019-12-15 ENCOUNTER — Telehealth: Payer: Self-pay | Admitting: Family Medicine

## 2019-12-15 DIAGNOSIS — M25562 Pain in left knee: Secondary | ICD-10-CM

## 2019-12-15 NOTE — Telephone Encounter (Signed)
Left knee MRI looks good, no sign of stress fracture or meniscus cartilage tear.

## 2019-12-15 NOTE — Telephone Encounter (Signed)
I called them - they will send a cd to your attention.

## 2019-12-18 NOTE — Addendum Note (Signed)
Addended by: Lillia Carmel on: 12/18/2019 09:34 AM   Modules accepted: Orders

## 2019-12-22 NOTE — Telephone Encounter (Signed)
I don't get the images unless they are requested to send to me. I think they will come to you if Tammy gets them off fax.

## 2019-12-22 NOTE — Telephone Encounter (Signed)
Terri had already requested the CD.  Hopefully they'll send it soon.

## 2019-12-25 ENCOUNTER — Encounter: Payer: Self-pay | Admitting: Family Medicine

## 2019-12-28 ENCOUNTER — Encounter: Payer: Self-pay | Admitting: Family Medicine

## 2019-12-28 ENCOUNTER — Other Ambulatory Visit: Payer: Managed Care, Other (non HMO)

## 2019-12-28 DIAGNOSIS — M1712 Unilateral primary osteoarthritis, left knee: Secondary | ICD-10-CM

## 2019-12-28 DIAGNOSIS — M25562 Pain in left knee: Secondary | ICD-10-CM

## 2019-12-31 NOTE — Addendum Note (Signed)
Addended by: Lillia Carmel on: 12/31/2019 04:15 PM   Modules accepted: Orders

## 2020-01-01 DIAGNOSIS — M1712 Unilateral primary osteoarthritis, left knee: Secondary | ICD-10-CM | POA: Insufficient documentation

## 2020-01-02 NOTE — Telephone Encounter (Signed)
noted 

## 2020-01-04 ENCOUNTER — Telehealth: Payer: Self-pay

## 2020-01-04 NOTE — Telephone Encounter (Signed)
Changed medication to [facility]  PA faxed to [facility] for approval

## 2020-01-08 ENCOUNTER — Other Ambulatory Visit: Payer: Self-pay

## 2020-01-12 ENCOUNTER — Ambulatory Visit (INDEPENDENT_AMBULATORY_CARE_PROVIDER_SITE_OTHER): Payer: Managed Care, Other (non HMO) | Admitting: Internal Medicine

## 2020-01-12 ENCOUNTER — Encounter: Payer: Self-pay | Admitting: Internal Medicine

## 2020-01-12 ENCOUNTER — Other Ambulatory Visit: Payer: Self-pay

## 2020-01-12 VITALS — BP 106/64 | HR 69 | Temp 98.3°F | Ht 60.0 in | Wt 115.2 lb

## 2020-01-12 DIAGNOSIS — M810 Age-related osteoporosis without current pathological fracture: Secondary | ICD-10-CM | POA: Diagnosis not present

## 2020-01-12 NOTE — Patient Instructions (Signed)
-   Please make sure you are consuming 1200 mg of calcium daily     FOODS THAT CONTAIN CALCIUM  Calcium can be found in many foods, not only in dairy products.  Dairy Foods  Yogurt (1 cup) 350 mg  Milk (1 cup) 300 mg  Cheddar cheese (1 oz.) 204 mg  Ricotta cheese, part skim (1/4 cup) 169 mg  Cottage cheese (1 cup) 150 mg  Nondairy Foods  Whole Grain Total cereal (3/4 cup) 1000 mg  Pink salmon with bones, sardines (3 oz., cooked) 181 mg  Black beans (1 cup) 103 mg  Broccoli (1 cup, cooked) 150 mg  Almonds (1 tbsp.) 50 mg  Soy Products  Soy yogurt with calcium (3/4 cup) 300 mg  Soy milk enriched with calcium (1 cup) 300 mg  Tofu, firm or extra firm (1/4 cup) 250 mg  Soy nuts, roasted/salted (1/2 cup) 103 mg
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
## 2020-01-13 ENCOUNTER — Encounter: Payer: Self-pay | Admitting: Internal Medicine

## 2020-01-14 ENCOUNTER — Encounter: Payer: Self-pay | Admitting: Family Medicine

## 2020-01-14 ENCOUNTER — Telehealth: Payer: Self-pay

## 2020-01-14 ENCOUNTER — Other Ambulatory Visit: Payer: Self-pay

## 2020-01-14 ENCOUNTER — Other Ambulatory Visit: Payer: Managed Care, Other (non HMO)

## 2020-01-14 DIAGNOSIS — M810 Age-related osteoporosis without current pathological fracture: Secondary | ICD-10-CM

## 2020-01-14 NOTE — Telephone Encounter (Signed)
FYI   I spoke with the pt and she stated she doesn't want to proceed with the injection. I did inform her that if she decides she wants to proceed after 5/10 I will have to get it preauthorized.

## 2020-01-14 NOTE — Telephone Encounter (Signed)
Approved for Monovisc-Left knee Dr. Prince Rome Buy and Annette Stable No copay 30% OOP Prior auth required [place] and received auth # over the phone Auth # [id number] Dates: 01/04/20-01/25/20  New start

## 2020-01-14 NOTE — Telephone Encounter (Signed)
Lvm for pt to call back to schedule...ok to schedule @ next available  

## 2020-01-14 NOTE — Telephone Encounter (Signed)
Dr. Hilts is aware. 

## 2020-01-15 ENCOUNTER — Encounter: Payer: Self-pay | Admitting: Internal Medicine

## 2020-01-15 LAB — CALCIUM, URINE, 24 HOUR: Calcium, 24H Urine: 156 mg/24 h

## 2020-01-15 LAB — CREATININE, URINE, 24 HOUR: Creatinine, 24H Ur: 0.86 g/(24.h) (ref 0.50–2.15)

## 2020-01-15 LAB — EXTRA URINE SPECIMEN

## 2020-04-20 ENCOUNTER — Other Ambulatory Visit: Payer: Self-pay | Admitting: Family Medicine

## 2020-04-20 DIAGNOSIS — Z1231 Encounter for screening mammogram for malignant neoplasm of breast: Secondary | ICD-10-CM

## 2020-05-30 ENCOUNTER — Other Ambulatory Visit: Payer: Self-pay

## 2020-05-30 ENCOUNTER — Encounter: Payer: Self-pay | Admitting: Family Medicine

## 2020-05-30 ENCOUNTER — Ambulatory Visit (INDEPENDENT_AMBULATORY_CARE_PROVIDER_SITE_OTHER): Payer: BC Managed Care – PPO | Admitting: Family Medicine

## 2020-05-30 VITALS — BP 100/78 | HR 82 | Temp 98.1°F | Ht 59.75 in | Wt 119.1 lb

## 2020-05-30 DIAGNOSIS — Z13220 Encounter for screening for lipoid disorders: Secondary | ICD-10-CM

## 2020-05-30 DIAGNOSIS — M810 Age-related osteoporosis without current pathological fracture: Secondary | ICD-10-CM

## 2020-05-30 DIAGNOSIS — Z0000 Encounter for general adult medical examination without abnormal findings: Secondary | ICD-10-CM | POA: Diagnosis not present
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
## 2020-05-31 LAB — COMPREHENSIVE METABOLIC PANEL
AG Ratio: 1.3 (calc) (ref 1.0–2.5)
ALT: 12 U/L (ref 6–29)
AST: 19 U/L (ref 10–35)
Albumin: 4.3 g/dL (ref 3.6–5.1)
Alkaline phosphatase (APISO): 61 U/L (ref 37–153)
BUN: 22 mg/dL (ref 7–25)
CO2: 30 mmol/L (ref 20–32)
Calcium: 9.3 mg/dL (ref 8.6–10.4)
Chloride: 103 mmol/L (ref 98–110)
Creat: 0.8 mg/dL (ref 0.50–1.05)
Globulin: 3.2 g/dL (calc) (ref 1.9–3.7)
Glucose, Bld: 97 mg/dL (ref 65–99)
Potassium: 4.3 mmol/L (ref 3.5–5.3)
Sodium: 140 mmol/L (ref 135–146)
Total Bilirubin: 0.3 mg/dL (ref 0.2–1.2)
Total Protein: 7.5 g/dL (ref 6.1–8.1)

## 2020-05-31 LAB — LIPID PANEL
Cholesterol: 206 mg/dL — ABNORMAL HIGH (ref <200)
HDL: 71 mg/dL (ref >=50)
LDL Cholesterol (Calc): 117 mg/dL (calc) — ABNORMAL HIGH
Non-HDL Cholesterol (Calc): 135 mg/dL (calc) — ABNORMAL HIGH (ref <130)
Total CHOL/HDL Ratio: 2.9 (calc) (ref <5.0)
Triglycerides: 83 mg/dL (ref <150)

## 2020-06-01 ENCOUNTER — Encounter: Payer: Self-pay | Admitting: Family Medicine

## 2020-06-01 ENCOUNTER — Other Ambulatory Visit: Payer: Self-pay

## 2020-06-01 ENCOUNTER — Ambulatory Visit
Admission: RE | Admit: 2020-06-01 | Discharge: 2020-06-01 | Disposition: A | Discharge status: Home / Self Care | Payer: Managed Care, Other (non HMO) | Source: Ambulatory Visit | Attending: Family Medicine | Admitting: Family Medicine

## 2020-06-01 ENCOUNTER — Encounter: Payer: Managed Care, Other (non HMO) | Admitting: Family Medicine

## 2020-06-01 DIAGNOSIS — Z1231 Encounter for screening mammogram for malignant neoplasm of breast: Secondary | ICD-10-CM

## 2020-06-06 ENCOUNTER — Other Ambulatory Visit: Payer: Self-pay | Admitting: Family Medicine

## 2020-06-06 DIAGNOSIS — R928 Other abnormal and inconclusive findings on diagnostic imaging of breast: Secondary | ICD-10-CM

## 2020-06-10 ENCOUNTER — Ambulatory Visit
Admission: RE | Admit: 2020-06-10 | Discharge: 2020-06-10 | Disposition: A | Discharge status: Home / Self Care | Payer: BC Managed Care – PPO | Source: Ambulatory Visit | Attending: Family Medicine | Admitting: Family Medicine

## 2020-06-10 ENCOUNTER — Other Ambulatory Visit: Payer: Self-pay

## 2020-06-10 ENCOUNTER — Other Ambulatory Visit: Payer: Self-pay | Admitting: Family Medicine

## 2020-06-10 DIAGNOSIS — R921 Mammographic calcification found on diagnostic imaging of breast: Secondary | ICD-10-CM | POA: Diagnosis not present

## 2020-06-10 DIAGNOSIS — R928 Other abnormal and inconclusive findings on diagnostic imaging of breast: Secondary | ICD-10-CM

## 2020-06-13 ENCOUNTER — Other Ambulatory Visit: Payer: Self-pay | Admitting: *Deleted

## 2020-06-13 DIAGNOSIS — R921 Mammographic calcification found on diagnostic imaging of breast: Secondary | ICD-10-CM

## 2020-07-07 DIAGNOSIS — D1801 Hemangioma of skin and subcutaneous tissue: Secondary | ICD-10-CM | POA: Diagnosis not present

## 2020-07-07 DIAGNOSIS — L218 Other seborrheic dermatitis: Secondary | ICD-10-CM | POA: Diagnosis not present

## 2020-07-07 DIAGNOSIS — D225 Melanocytic nevi of trunk: Secondary | ICD-10-CM | POA: Diagnosis not present

## 2020-07-07 DIAGNOSIS — L821 Other seborrheic keratosis: Secondary | ICD-10-CM | POA: Diagnosis not present

## 2020-08-30 ENCOUNTER — Encounter: Payer: Self-pay | Admitting: Family Medicine

## 2020-12-23 ENCOUNTER — Ambulatory Visit
Admission: RE | Admit: 2020-12-23 | Discharge: 2020-12-23 | Disposition: A | Discharge status: Home / Self Care | Payer: BC Managed Care – PPO | Source: Ambulatory Visit | Attending: Family Medicine | Admitting: Family Medicine

## 2020-12-23 ENCOUNTER — Other Ambulatory Visit: Payer: Self-pay | Admitting: Family Medicine

## 2020-12-23 ENCOUNTER — Other Ambulatory Visit: Payer: Self-pay

## 2020-12-23 DIAGNOSIS — R921 Mammographic calcification found on diagnostic imaging of breast: Secondary | ICD-10-CM

## 2021-01-12 ENCOUNTER — Ambulatory Visit: Payer: Managed Care, Other (non HMO) | Admitting: Internal Medicine

## 2021-02-24 DIAGNOSIS — H04123 Dry eye syndrome of bilateral lacrimal glands: Secondary | ICD-10-CM | POA: Diagnosis not present

## 2021-03-22 ENCOUNTER — Telehealth: Payer: Self-pay

## 2021-03-22 NOTE — Telephone Encounter (Signed)
Patient called she wants to get pre approval process started for a gel injection call back:[phone]

## 2021-03-22 NOTE — Telephone Encounter (Signed)
Noted  

## 2021-03-27 ENCOUNTER — Ambulatory Visit (INDEPENDENT_AMBULATORY_CARE_PROVIDER_SITE_OTHER): Payer: BC Managed Care – PPO | Admitting: Family Medicine

## 2021-03-27 ENCOUNTER — Ambulatory Visit: Payer: Self-pay

## 2021-03-27 ENCOUNTER — Other Ambulatory Visit: Payer: Self-pay

## 2021-03-27 ENCOUNTER — Encounter: Payer: Self-pay | Admitting: Family Medicine

## 2021-03-27 DIAGNOSIS — G8929 Other chronic pain: Secondary | ICD-10-CM | POA: Diagnosis not present

## 2021-03-27 DIAGNOSIS — M25562 Pain in left knee: Secondary | ICD-10-CM | POA: Diagnosis not present
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
## 2021-04-11 ENCOUNTER — Telehealth: Payer: Self-pay

## 2021-04-11 NOTE — Telephone Encounter (Signed)
VOB submitted for SynviscOne, left knee. Pending BV. 

## 2021-04-12 ENCOUNTER — Telehealth: Payer: Self-pay | Admitting: Family Medicine

## 2021-04-12 ENCOUNTER — Encounter: Payer: Self-pay | Admitting: Family Medicine

## 2021-04-12 NOTE — Telephone Encounter (Signed)
Patient called asked if the gel injection was approved? The number to contact patient is [phone]

## 2021-04-13 ENCOUNTER — Telehealth: Payer: Self-pay

## 2021-04-13 NOTE — Telephone Encounter (Signed)
Talked with patient.  See previous message in chart

## 2021-04-13 NOTE — Telephone Encounter (Signed)
Talked with patient concerning gel injection.  Advised patient an authorization was required and that I will have a response within 24-72hrs per Covermymeds.  Patient voiced that she understands.

## 2021-04-13 NOTE — Telephone Encounter (Signed)
PA required for SynviscOne, left knee. PA has been submitted online through Covermymeds. PA Pending# [id number]

## 2021-04-14 ENCOUNTER — Telehealth: Payer: Self-pay

## 2021-04-14 NOTE — Telephone Encounter (Signed)
Approved for SynvsicOne, left knee. Buy & Bill Must meet deductible first Patient will be responsible for 50% OOP. No Co-pay PA Approval# [id number] Valid 04/13/2021- 10/09/2021  Appt. 04/18/2021 with Dr. Prince Rome

## 2021-04-18 ENCOUNTER — Ambulatory Visit: Payer: Self-pay

## 2021-04-18 ENCOUNTER — Other Ambulatory Visit: Payer: Self-pay

## 2021-04-18 ENCOUNTER — Encounter: Payer: Self-pay | Admitting: Family Medicine

## 2021-04-18 ENCOUNTER — Ambulatory Visit (INDEPENDENT_AMBULATORY_CARE_PROVIDER_SITE_OTHER): Payer: BC Managed Care – PPO | Admitting: Family Medicine

## 2021-04-18 DIAGNOSIS — M1712 Unilateral primary osteoarthritis, left knee: Secondary | ICD-10-CM | POA: Diagnosis not present

## 2021-04-18 DIAGNOSIS — G8929 Other chronic pain: Secondary | ICD-10-CM | POA: Diagnosis not present

## 2021-04-18 NOTE — Progress Notes (Signed)
Subjective: She is here for planned left knee Synvisc 1 injection.  Objective: No significant effusion.  Tender on the medial joint line.  Procedure: Left knee injection: After sterile prep with Betadine, injected 5 cc 1% lidocaine without epinephrine and Synvisc 1 from medial midpatellar approach into the joint recess using ultrasound to guide needle placement.  Injectate was seen filling the joint capsule.  Follow-up as needed.

## 2021-04-21 ENCOUNTER — Encounter: Payer: Self-pay | Admitting: Family Medicine

## 2021-04-23 ENCOUNTER — Encounter: Payer: Self-pay | Admitting: Family Medicine

## 2021-04-24 ENCOUNTER — Encounter: Payer: Self-pay | Admitting: Family Medicine

## 2021-05-29 ENCOUNTER — Emergency Department (HOSPITAL_BASED_OUTPATIENT_CLINIC_OR_DEPARTMENT_OTHER)
Admission: EM | Admit: 2021-05-29 | Discharge: 2021-05-29 | Disposition: A | Discharge status: Home / Self Care | Payer: BC Managed Care – PPO | Attending: Emergency Medicine | Admitting: Emergency Medicine

## 2021-05-29 ENCOUNTER — Emergency Department (HOSPITAL_BASED_OUTPATIENT_CLINIC_OR_DEPARTMENT_OTHER): Payer: BC Managed Care – PPO

## 2021-05-29 ENCOUNTER — Other Ambulatory Visit: Payer: Self-pay

## 2021-05-29 ENCOUNTER — Telehealth: Payer: Self-pay | Admitting: Family Medicine

## 2021-05-29 ENCOUNTER — Encounter: Payer: Self-pay | Admitting: Family Medicine

## 2021-05-29 ENCOUNTER — Encounter (HOSPITAL_BASED_OUTPATIENT_CLINIC_OR_DEPARTMENT_OTHER): Payer: Self-pay

## 2021-05-29 DIAGNOSIS — M545 Low back pain, unspecified: Secondary | ICD-10-CM | POA: Diagnosis not present

## 2021-05-29 DIAGNOSIS — M549 Dorsalgia, unspecified: Secondary | ICD-10-CM | POA: Diagnosis not present

## 2021-05-29 DIAGNOSIS — M5440 Lumbago with sciatica, unspecified side: Secondary | ICD-10-CM | POA: Diagnosis not present

## 2021-05-29 DIAGNOSIS — M5459 Other low back pain: Secondary | ICD-10-CM | POA: Diagnosis not present

## 2021-05-29 LAB — BASIC METABOLIC PANEL
Anion gap: 10 (ref 5–15)
BUN: 17 mg/dL (ref 6–20)
CO2: 22 mmol/L (ref 22–32)
Calcium: 9.4 mg/dL (ref 8.9–10.3)
Chloride: 104 mmol/L (ref 98–111)
Creatinine, Ser: 0.78 mg/dL (ref 0.44–1.00)
GFR, Estimated: >60 mL/min (ref >60)
Glucose, Bld: 108 mg/dL — ABNORMAL HIGH (ref 70–99)
Potassium: 3.7 mmol/L (ref 3.5–5.1)
Sodium: 136 mmol/L (ref 135–145)

## 2021-05-29 LAB — CBC
HCT: 41.9 % (ref 36.0–46.0)
Hemoglobin: 14.3 g/dL (ref 12.0–15.0)
MCH: 30.6 pg (ref 26.0–34.0)
MCHC: 34.1 g/dL (ref 30.0–36.0)
MCV: 89.7 fL (ref 80.0–100.0)
Platelets: 310 10*3/uL (ref 150–400)
RBC: 4.67 MIL/uL (ref 3.87–5.11)
RDW: 13.1 % (ref 11.5–15.5)
WBC: 10 10*3/uL (ref 4.0–10.5)
nRBC: 0 % (ref 0.0–0.2)

## 2021-05-29 MED ORDER — METHOCARBAMOL 500 MG PO TABS: 500.0000 mg | ORAL_TABLET | Freq: Three times a day (TID) | ORAL | 0 refills | Status: DC | PRN

## 2021-05-29 MED ORDER — KETOROLAC TROMETHAMINE 15 MG/ML IJ SOLN
15.0000 mg | Freq: Once | INTRAMUSCULAR | Status: AC
  Administered 2021-05-29: 15 mg via INTRAVENOUS
  Filled 2021-05-29: qty 1

## 2021-05-29 MED ORDER — HYDROMORPHONE HCL 1 MG/ML IJ SOLN
0.5000 mg | Freq: Once | INTRAMUSCULAR | Status: AC
  Administered 2021-05-29: 0.5 mg via INTRAVENOUS
  Filled 2021-05-29: qty 1

## 2021-05-29 MED ORDER — LIDOCAINE 5 % EX PTCH: 1.0000 | MEDICATED_PATCH | Freq: Every day | CUTANEOUS | 0 refills | Status: DC | PRN

## 2021-05-29 MED ORDER — LIDOCAINE 5 % EX PTCH
2.0000 | MEDICATED_PATCH | CUTANEOUS | Status: DC
  Administered 2021-05-29: 2 via TRANSDERMAL
  Filled 2021-05-29: qty 2

## 2021-05-29 MED ORDER — ACETAMINOPHEN 325 MG PO TABS
650.0000 mg | ORAL_TABLET | Freq: Once | ORAL | Status: AC
  Administered 2021-05-29: 650 mg via ORAL
  Filled 2021-05-29: qty 2

## 2021-05-29 MED ORDER — MELOXICAM 15 MG PO TABS: 15.0000 mg | ORAL_TABLET | Freq: Every day | ORAL | 0 refills | Status: DC

## 2021-05-29 MED ORDER — METHOCARBAMOL 500 MG PO TABS
500.0000 mg | ORAL_TABLET | Freq: Once | ORAL | Status: AC
  Administered 2021-05-29: 500 mg via ORAL
  Filled 2021-05-29: qty 1

## 2021-05-29 NOTE — ED Notes (Signed)
Pt requested until 1530 for X-Ray to allow for pain medication to take affect.

## 2021-05-29 NOTE — ED Triage Notes (Signed)
Pt states low back pain onset 1 week ago w/spasms.  Took Aleve yesterday w/no relief. No recent falls or injury.

## 2021-05-29 NOTE — ED Notes (Signed)
Pt ambulated in hall to nurse's station w/walker, steady gait noted.

## 2021-05-29 NOTE — ED Notes (Signed)
IV access attempted x 2 unsuccessful, 2nd RN to attempt.

## 2021-05-29 NOTE — ED Notes (Signed)
Pt refused covid swab and ambulation.  Will await ambulation until 1845 per pt request, provider aware.

## 2021-05-29 NOTE — Telephone Encounter (Signed)
Pt call and stated she want to know can she take her husband Oxycodone that he have that is 54 yrs old and want a call back.

## 2021-05-29 NOTE — ED Notes (Signed)
Pt states that she is unable to ambulate at this time
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
## 2021-05-29 NOTE — ED Notes (Signed)
ED Provider at bedside. 

## 2021-05-29 NOTE — Telephone Encounter (Signed)
PT called wanting advise in regards to her Mychart message and to make sure that Dr.Koberlein has gotten the message as she is in severe pain and wants advise on what to do and take.

## 2021-05-29 NOTE — Discharge Instructions (Addendum)
You were seen in the emergency department today for back pain. Your blood work was reassuring and did not show any significant abnormalities.  Your lumbar spine x-ray did not show any significant acute abnormalities.  We suspect your pain is due to muscular spasms and/or irritation of your sacroiliac joint.  We are sending home with the following medicines to help with your symptoms:  - Mobic is a nonsteroidal anti-inflammatory medication that will help with pain and swelling. Be sure to take this medication as prescribed with food, 1 pill every 24 hours,  It should be taken with food, as it can cause stomach upset, and more seriously, stomach bleeding. Do not take other nonsteroidal anti-inflammatory medications with this such as Advil, Motrin, Aleve, Mobic, Goodie Powder, or Motrin.    - Robaxin is the muscle relaxer I have prescribed, this is meant to help with muscle tightness. Be aware that this medication may make you drowsy therefore the first time you take this it should be at a time you are in an environment where you can rest. Do not drive or operate heavy machinery when taking this medication. Do not drink alcohol or take other sedating medications with this medicine such as narcotics or benzodiazepines.   - Lidoderm patch-apply 1 patch to area with significant pain once per day.  Remove and discard patch within 12 hours of application.  You make take Tylenol per over the counter dosing with these medications.   We have prescribed you new medication(s) today. Discuss the medications prescribed today with your pharmacist as they can have adverse effects and interactions with your other medicines including over the counter and prescribed medications. Seek medical evaluation if you start to experience new or abnormal symptoms after taking one of these medicines, seek care immediately if you start to experience difficulty breathing, feeling of your throat closing, facial swelling, or rash as these  could be indications of a more serious allergic reaction  Please apply heat to your lower back.  Please try to get up and move throughout the day to avoid stiffness.   Follow up with sports medicine as soon as possible.  Return to the ER for any new or worsening symptoms including but not limited to new or worsening pain, inability to walk, numbness, weakness, loss of control of your bowel or bladder function, fever, or any other concerns.

## 2021-05-29 NOTE — ED Notes (Signed)
Pt declined heat pack

## 2021-05-29 NOTE — Telephone Encounter (Signed)
Spoke with the patient and informed her our office does not recommend taking any medications that have not been prescribed for someone else.

## 2021-05-29 NOTE — ED Notes (Signed)
Pt refused CT  

## 2021-05-29 NOTE — ED Notes (Signed)
Pt placed on bedpan, offered to ambulated to RR pt declined

## 2021-05-29 NOTE — ED Notes (Signed)
Pt now requesting heat packs, heat packs given to patient, attempted to assist patient with placement, pt declined. Also states she is unable to sit up in bed

## 2021-05-29 NOTE — ED Notes (Signed)
After some convincing, and positioning pt in sitting position in bed, pt was able to stand up on her own power, using a walker.  She was able to navigate around the room with no support from me or her husband.  She took some steps and continued to walk outside of the room for a collective 10 min's of constant motion.  Has some tightness, but walked consistently with no gait issues and with little support from the walker, good posture noted throughout ambulation.

## 2021-05-30 ENCOUNTER — Telehealth: Payer: Self-pay | Admitting: Family Medicine

## 2021-05-30 NOTE — Telephone Encounter (Signed)
Spoke with the patient and advised [facility] as the medication was prescribed by a physician there and not Dr Hassan Rowan.

## 2021-05-30 NOTE — Telephone Encounter (Signed)
PT called to advise that when they went to pick up their lidocaine (LIDODERM) 5 % they were told they need a okay from their Dr before they can be given the patches. The patches were prescribed by the ED Drs but they are asking for a okay from the PCP. Please advise.

## 2021-06-01 ENCOUNTER — Encounter: Payer: Self-pay | Admitting: Family Medicine

## 2021-06-01 ENCOUNTER — Ambulatory Visit (INDEPENDENT_AMBULATORY_CARE_PROVIDER_SITE_OTHER): Payer: BC Managed Care – PPO | Admitting: Family Medicine

## 2021-06-01 ENCOUNTER — Other Ambulatory Visit: Payer: Self-pay

## 2021-06-01 VITALS — Ht 60.0 in | Wt 125.0 lb

## 2021-06-01 DIAGNOSIS — S39012A Strain of muscle, fascia and tendon of lower back, initial encounter: Secondary | ICD-10-CM | POA: Diagnosis not present

## 2021-06-01 MED ORDER — HYDROCODONE-ACETAMINOPHEN 5-325 MG PO TABS: 1.0000 | ORAL_TABLET | Freq: Three times a day (TID) | ORAL | 0 refills | Status: DC | PRN

## 2021-06-01 MED ORDER — MELOXICAM 7.5 MG PO TABS: 7.5000 mg | ORAL_TABLET | Freq: Two times a day (BID) | ORAL | 1 refills | Status: DC | PRN

## 2021-06-01 NOTE — Patient Instructions (Signed)
Nice to meet you Please continue heat  Please try the exercises  Please try physical therapy   Please send me a message in MyChart with any questions or updates.  Please see me back in 2 weeks.   --Dr. Jordan Likes

## 2021-06-01 NOTE — Assessment & Plan Note (Signed)
Symptoms most consistent with spasm and strain.  Still having pain with flexion. -Counseled on home exercise therapy and supportive care. -Referral to physical therapy. -Meloxicam. -Norco. -Could consider further imaging.
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
## 2021-06-05 ENCOUNTER — Encounter: Payer: BC Managed Care – PPO | Admitting: Family Medicine

## 2021-06-14 ENCOUNTER — Encounter: Payer: Self-pay | Admitting: Family Medicine

## 2021-06-14 ENCOUNTER — Other Ambulatory Visit: Payer: Self-pay | Admitting: Family Medicine

## 2021-06-14 MED ORDER — METHOCARBAMOL 500 MG PO TABS: 500.0000 mg | ORAL_TABLET | Freq: Three times a day (TID) | ORAL | 1 refills | Status: DC | PRN

## 2021-06-15 ENCOUNTER — Other Ambulatory Visit: Payer: Self-pay

## 2021-06-15 ENCOUNTER — Encounter: Payer: Self-pay | Admitting: Family Medicine

## 2021-06-15 ENCOUNTER — Ambulatory Visit (INDEPENDENT_AMBULATORY_CARE_PROVIDER_SITE_OTHER): Payer: BC Managed Care – PPO | Admitting: Family Medicine

## 2021-06-15 DIAGNOSIS — S39012A Strain of muscle, fascia and tendon of lower back, initial encounter: Secondary | ICD-10-CM | POA: Diagnosis not present
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
## 2021-06-15 NOTE — Assessment & Plan Note (Signed)
Has slowly started getting improvement.  Does get improvement with the muscle relaxer -Counseled on home exercise therapy and supportive care. -Has physical therapy established. -Follow-up in 4 weeks.

## 2021-06-23 ENCOUNTER — Ambulatory Visit: Payer: BC Managed Care – PPO | Attending: Family Medicine | Admitting: Physical Therapy

## 2021-06-23 ENCOUNTER — Other Ambulatory Visit: Payer: Self-pay

## 2021-06-23 DIAGNOSIS — M6281 Muscle weakness (generalized): Secondary | ICD-10-CM | POA: Insufficient documentation

## 2021-06-23 DIAGNOSIS — R29898 Other symptoms and signs involving the musculoskeletal system: Secondary | ICD-10-CM | POA: Diagnosis not present

## 2021-06-23 DIAGNOSIS — M62830 Muscle spasm of back: Secondary | ICD-10-CM | POA: Diagnosis not present

## 2021-06-23 DIAGNOSIS — M545 Low back pain, unspecified: Secondary | ICD-10-CM | POA: Diagnosis not present

## 2021-06-23 NOTE — Patient Instructions (Signed)
        Access Code: [email or web address] URL: [email or web address] Date: 06/23/2021 Prepared by: Glenetta Hew  Exercises Supine Single Knee to Chest Stretch - 2-3 x daily - 7 x weekly - 3 reps - 30 sec hold Hooklying Hamstring Stretch with Strap - 2-3 x daily - 7 x weekly - 3 reps - 30 sec hold Supine Piriformis Stretch with Leg Straight - 2-3 x daily - 7 x weekly - 3 reps - 30 sec hold Supine Figure 4 Piriformis Stretch - 2-3 x daily - 7 x weekly - 3 reps - 30 sec hold Supine Lower Trunk Rotation - 2-3 x daily - 7 x weekly - 2 sets - 5 reps - 10 sec hold Supine Posterior Pelvic Tilt - 2 x daily - 7 x weekly - 2 sets - 10 reps - 5 sec hold Supine March with Resistance Band - 1 x daily - 7 x weekly - 2 sets - 10 reps - 2-3 sec hold hold Bridge with Hip Abduction and Resistance - 1 x daily - 7 x weekly - 2 sets - 10 reps - 5 sec hold Clamshell with Resistance - 1 x daily - 7 x weekly - 2 sets - 10 reps - 3-5 sec hold
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
## 2021-06-26 ENCOUNTER — Ambulatory Visit
Admission: RE | Admit: 2021-06-26 | Discharge: 2021-06-26 | Disposition: A | Discharge status: Home / Self Care | Payer: BC Managed Care – PPO | Source: Ambulatory Visit | Attending: Family Medicine | Admitting: Family Medicine

## 2021-06-26 ENCOUNTER — Other Ambulatory Visit: Payer: Self-pay | Admitting: Family Medicine

## 2021-06-26 ENCOUNTER — Other Ambulatory Visit: Payer: Self-pay

## 2021-06-26 DIAGNOSIS — R921 Mammographic calcification found on diagnostic imaging of breast: Secondary | ICD-10-CM

## 2021-06-26 DIAGNOSIS — R922 Inconclusive mammogram: Secondary | ICD-10-CM | POA: Diagnosis not present

## 2021-06-30 ENCOUNTER — Other Ambulatory Visit: Payer: Self-pay

## 2021-06-30 ENCOUNTER — Ambulatory Visit
Admission: RE | Admit: 2021-06-30 | Discharge: 2021-06-30 | Disposition: A | Discharge status: Home / Self Care | Payer: BC Managed Care – PPO | Source: Ambulatory Visit | Attending: Family Medicine | Admitting: Family Medicine

## 2021-06-30 DIAGNOSIS — R921 Mammographic calcification found on diagnostic imaging of breast: Secondary | ICD-10-CM | POA: Diagnosis not present

## 2021-06-30 DIAGNOSIS — N6489 Other specified disorders of breast: Secondary | ICD-10-CM | POA: Diagnosis not present

## 2021-07-10 ENCOUNTER — Ambulatory Visit (INDEPENDENT_AMBULATORY_CARE_PROVIDER_SITE_OTHER): Payer: BC Managed Care – PPO | Admitting: Family Medicine

## 2021-07-10 ENCOUNTER — Other Ambulatory Visit: Payer: Self-pay

## 2021-07-10 ENCOUNTER — Encounter: Payer: Self-pay | Admitting: Family Medicine

## 2021-07-10 VITALS — BP 116/68 | HR 85 | Temp 98.6°F | Ht 59.5 in | Wt 128.2 lb

## 2021-07-10 DIAGNOSIS — Z0000 Encounter for general adult medical examination without abnormal findings: Secondary | ICD-10-CM

## 2021-07-10 DIAGNOSIS — M810 Age-related osteoporosis without current pathological fracture: Secondary | ICD-10-CM | POA: Diagnosis not present

## 2021-07-10 DIAGNOSIS — Z13220 Encounter for screening for lipoid disorders: Secondary | ICD-10-CM

## 2021-07-10 LAB — COMPREHENSIVE METABOLIC PANEL
ALT: 13 U/L (ref 0–35)
AST: 23 U/L (ref 0–37)
Albumin: 4.4 g/dL (ref 3.5–5.2)
Alkaline Phosphatase: 64 U/L (ref 39–117)
BUN: 18 mg/dL (ref 6–23)
CO2: 29 mEq/L (ref 19–32)
Calcium: 9.6 mg/dL (ref 8.4–10.5)
Chloride: 102 mEq/L (ref 96–112)
Creatinine, Ser: 0.79 mg/dL (ref 0.40–1.20)
GFR: 84.67 mL/min (ref >60.00)
Glucose, Bld: 94 mg/dL (ref 70–99)
Potassium: 4.4 mEq/L (ref 3.5–5.1)
Sodium: 138 mEq/L (ref 135–145)
Total Bilirubin: 0.3 mg/dL (ref 0.2–1.2)
Total Protein: 8 g/dL (ref 6.0–8.3)

## 2021-07-10 LAB — VITAMIN D 25 HYDROXY (VIT D DEFICIENCY, FRACTURES): VITD: 48.62 ng/mL (ref 30.00–100.00)

## 2021-07-10 LAB — LIPID PANEL
Cholesterol: 210 mg/dL — ABNORMAL HIGH (ref 0–200)
HDL: 72.5 mg/dL (ref >39.00)
LDL Cholesterol: 122 mg/dL — ABNORMAL HIGH (ref 0–99)
NonHDL: 137.3
Total CHOL/HDL Ratio: 3
Triglycerides: 76 mg/dL (ref 0.0–149.0)
VLDL: 15.2 mg/dL (ref 0.0–40.0)
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
## 2021-07-10 NOTE — Addendum Note (Signed)
Addended by: Marian Sorrow D on: 07/10/2021 09:58 AM   Modules accepted: Orders

## 2021-07-13 DIAGNOSIS — L814 Other melanin hyperpigmentation: Secondary | ICD-10-CM | POA: Diagnosis not present

## 2021-07-13 DIAGNOSIS — L720 Epidermal cyst: Secondary | ICD-10-CM | POA: Diagnosis not present

## 2021-07-13 DIAGNOSIS — D2261 Melanocytic nevi of right upper limb, including shoulder: Secondary | ICD-10-CM | POA: Diagnosis not present

## 2021-07-13 DIAGNOSIS — L821 Other seborrheic keratosis: Secondary | ICD-10-CM | POA: Diagnosis not present

## 2021-07-21 ENCOUNTER — Encounter: Payer: BC Managed Care – PPO | Admitting: Physical Therapy

## 2021-07-25 ENCOUNTER — Other Ambulatory Visit: Payer: Self-pay | Admitting: Family Medicine

## 2021-07-25 DIAGNOSIS — M810 Age-related osteoporosis without current pathological fracture: Secondary | ICD-10-CM

## 2021-10-30 DIAGNOSIS — T1501XA Foreign body in cornea, right eye, initial encounter: Secondary | ICD-10-CM | POA: Diagnosis not present

## 2021-11-17 ENCOUNTER — Encounter: Payer: Self-pay | Admitting: Family Medicine

## 2021-11-29 ENCOUNTER — Telehealth: Payer: Self-pay

## 2021-11-29 NOTE — Telephone Encounter (Signed)
Noted  

## 2021-11-29 NOTE — Telephone Encounter (Signed)
VOB submitted for SynviscOne, left knee. BV pending. 

## 2021-11-29 NOTE — Telephone Encounter (Signed)
Please precert for Synvisc One of left knee. [facility] patient.  ?

## 2021-12-01 ENCOUNTER — Telehealth: Payer: Self-pay

## 2021-12-01 NOTE — Telephone Encounter (Signed)
PA required for SynviscOne, left knee. ?PA submitted through [facility]. ?PA pending# [id number] ?

## 2021-12-08 ENCOUNTER — Telehealth: Payer: Self-pay | Admitting: Orthopaedic Surgery

## 2021-12-08 NOTE — Telephone Encounter (Signed)
Called and left a VM advising patient to give me a call back concerning the gel injection. ?

## 2021-12-08 NOTE — Telephone Encounter (Signed)
Talked with patient and discussed denial for gel injection.  ?

## 2021-12-08 NOTE — Telephone Encounter (Signed)
Patient returned call asked for a call back. [phone] ?

## 2021-12-08 NOTE — Telephone Encounter (Signed)
Pt is wanting to know if the Gel Injection will be covered under her insurance, she has not heard from anyone. ? ?Please call the pt --today if possible  ?

## 2021-12-12 ENCOUNTER — Ambulatory Visit: Payer: BC Managed Care – PPO | Admitting: Orthopaedic Surgery

## 2021-12-25 ENCOUNTER — Ambulatory Visit
Admission: RE | Admit: 2021-12-25 | Discharge: 2021-12-25 | Disposition: A | Discharge status: Home / Self Care | Payer: BC Managed Care – PPO | Source: Ambulatory Visit | Attending: Family Medicine | Admitting: Family Medicine

## 2021-12-25 DIAGNOSIS — M810 Age-related osteoporosis without current pathological fracture: Secondary | ICD-10-CM | POA: Diagnosis not present

## 2021-12-25 DIAGNOSIS — Z780 Asymptomatic menopausal state: Secondary | ICD-10-CM | POA: Diagnosis not present

## 2021-12-27 ENCOUNTER — Ambulatory Visit (INDEPENDENT_AMBULATORY_CARE_PROVIDER_SITE_OTHER): Payer: BC Managed Care – PPO | Admitting: Orthopaedic Surgery

## 2021-12-27 ENCOUNTER — Encounter: Payer: Self-pay | Admitting: Orthopaedic Surgery

## 2021-12-27 ENCOUNTER — Telehealth: Payer: Self-pay | Admitting: Family Medicine

## 2021-12-27 DIAGNOSIS — M25562 Pain in left knee: Secondary | ICD-10-CM | POA: Diagnosis not present

## 2021-12-27 DIAGNOSIS — G8929 Other chronic pain: Secondary | ICD-10-CM | POA: Diagnosis not present

## 2021-12-27 NOTE — Telephone Encounter (Addendum)
Pt calling regarding results of prior MRI. Her current ortho doc wants to see the xrays and the patient cannot recall where she went for the MRI to retrieve those results, thinks it was around 2 years ago.  ?

## 2021-12-27 NOTE — Telephone Encounter (Signed)
pt found xray disc. disregard previous request for info ?
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
## 2022-01-02 ENCOUNTER — Encounter: Payer: Self-pay | Admitting: Family Medicine

## 2022-04-05 DIAGNOSIS — H00015 Hordeolum externum left lower eyelid: Secondary | ICD-10-CM | POA: Diagnosis not present

## 2022-04-16 ENCOUNTER — Encounter: Payer: Self-pay | Admitting: Family Medicine

## 2022-04-16 ENCOUNTER — Ambulatory Visit: Payer: BC Managed Care – PPO | Admitting: Family Medicine

## 2022-04-16 VITALS — BP 108/80 | HR 83 | Temp 98.6°F | Ht 59.5 in | Wt 131.0 lb

## 2022-04-16 DIAGNOSIS — M810 Age-related osteoporosis without current pathological fracture: Secondary | ICD-10-CM

## 2022-04-16 DIAGNOSIS — H00015 Hordeolum externum left lower eyelid: Secondary | ICD-10-CM

## 2022-04-16 NOTE — Assessment & Plan Note (Signed)
We reviewed her most recent DEXA, she is using vitamin supplements and increasing walking, She does not want to start any medications for this at this time. Will repeat her DEXA in 2 years to re-evaluate.

## 2022-04-16 NOTE — Progress Notes (Signed)
Established Patient Office Visit  Subjective   Patient ID: Erika Wagner, female    DOB: [birth year]  Age: 56 y.o. MRN: [id number]  Chief Complaint  Patient presents with   Establish Care    Osteoporosis - pt reports she is trying to reduce it on her own. States she is taking vitamin D, calcium supplements, collagen and vitamin K supplements. Has been on these supplements April. Denies any symptoms, no fractures or bone pain.  Left lower lid hordeolum - pt reports she went to see the eye doctor 3 weeks ago and was given topical tx and oral abx and has been using her hot compresses, however the hordeolum has not resolved. States that it never opened and drained but the pain and swelling did improve.     Review of Systems  All other systems reviewed and are negative.     Objective:     BP 108/80 (BP Location: Left Arm, Patient Position: Sitting, Cuff Size: Normal)   Pulse 83   Temp 98.6 F (37 C) (Oral)   Ht 4' 11.5" (1.511 m)   Wt 137 lb 4.8 oz (62.3 kg)   LMP 03/20/2016   SpO2 98%   BMI 27.27 kg/m    Physical Exam Vitals reviewed.  Constitutional:      Appearance: Normal appearance. She is well-groomed and normal weight.  HENT:     Head: Normocephalic and atraumatic.  Eyes:     General:        Left eye: Hordeolum present.    Extraocular Movements: Extraocular movements intact.     Conjunctiva/sclera: Conjunctivae normal.     Pupils: Pupils are equal, round, and reactive to light.   Cardiovascular:     Rate and Rhythm: Normal rate and regular rhythm.     Heart sounds: S1 normal and S2 normal.  Pulmonary:     Effort: Pulmonary effort is normal.     Breath sounds: Normal breath sounds and air entry.  Abdominal:     General: Bowel sounds are normal.  Musculoskeletal:        General: Normal range of motion.     Cervical back: Normal range of motion and neck supple.     Right lower leg: No edema.     Left lower leg: No edema.  Skin:    General: Skin is  warm and dry.  Neurological:     Mental Status: She is alert and oriented to person, place, and time. Mental status is at baseline.     Gait: Gait is intact.  Psychiatric:        Mood and Affect: Mood and affect normal.        Speech: Speech normal.        Behavior: Behavior normal.        Judgment: Judgment normal.      No results found for any visits on 04/16/22. DEXA scan, most current DEXA scan T-score is minus 2.7.    The 10-year ASCVD risk score (Arnett DK, et al., 2019) is: 1.2%    Assessment & Plan:   Problem List Items Addressed This Visit       Musculoskeletal and Integument   Osteoporosis - Primary    We reviewed her most recent DEXA, she is using vitamin supplements and increasing walking, She does not want to start any medications for this at this time. Will repeat her DEXA in 2 years to re-evaluate.        Other   Hordeolum externum  left lower eyelid    Patient went to the eye doctor and she took topicals as well as oral antibiotics. States that the pain improved however the red lump is still there. She is concerned because it has been there for 3 weeks. I recommended that she go back to the eye doctor, she may need an I and D of the area to help it drain.        Return in about 3 months (around 07/17/2022) for Well Woman Visit.    Karie Georges, MD

## 2022-04-16 NOTE — Patient Instructions (Signed)
Please call you eye doctor to make an appointment to follow up on the stye.  Return to office in 3 months for a Well Woman exam and annual blood work.

## 2022-04-16 NOTE — Assessment & Plan Note (Signed)
Patient went to the eye doctor and she took topicals as well as oral antibiotics. States that the pain improved however the red lump is still there. She is concerned because it has been there for 3 weeks. I recommended that she go back to the eye doctor, she may need an I and D of the area to help it drain.

## 2022-04-16 NOTE — Telephone Encounter (Signed)
Weight changed as below.

## 2022-05-28 ENCOUNTER — Telehealth: Payer: Self-pay | Admitting: Family Medicine

## 2022-05-28 ENCOUNTER — Other Ambulatory Visit: Payer: Self-pay | Admitting: Family Medicine

## 2022-05-28 DIAGNOSIS — Z1231 Encounter for screening mammogram for malignant neoplasm of breast: Secondary | ICD-10-CM

## 2022-05-28 NOTE — Telephone Encounter (Signed)
Pt called back to reschedule. Pt asked that I delete the second number. Call dropped in the middle of call. Tried calling Pt back. Busy signal.

## 2022-05-28 NOTE — Telephone Encounter (Signed)
Pt successfully rescheduled.

## 2022-05-28 NOTE — Telephone Encounter (Signed)
Called Pt, could not get thru to either number on file.  Called spouse French Ana) at [phone] from [employer]  Spouse stated he would ask Pt to call back to reschedule.

## 2022-05-28 NOTE — Telephone Encounter (Signed)
Noted  

## 2022-07-16 ENCOUNTER — Encounter: Payer: BC Managed Care – PPO | Admitting: Family Medicine

## 2022-07-16 ENCOUNTER — Ambulatory Visit
Admission: RE | Admit: 2022-07-16 | Discharge: 2022-07-16 | Disposition: A | Discharge status: Home / Self Care | Payer: BC Managed Care – PPO | Source: Ambulatory Visit | Attending: Family Medicine | Admitting: Family Medicine

## 2022-07-16 DIAGNOSIS — Z1231 Encounter for screening mammogram for malignant neoplasm of breast: Secondary | ICD-10-CM

## 2022-07-18 ENCOUNTER — Other Ambulatory Visit (HOSPITAL_COMMUNITY)
Admission: RE | Admit: 2022-07-18 | Discharge: 2022-07-18 | Disposition: A | Discharge status: Home / Self Care | Payer: BC Managed Care – PPO | Source: Ambulatory Visit | Attending: Family Medicine | Admitting: Family Medicine

## 2022-07-18 ENCOUNTER — Encounter: Payer: Self-pay | Admitting: Family Medicine

## 2022-07-18 ENCOUNTER — Ambulatory Visit (INDEPENDENT_AMBULATORY_CARE_PROVIDER_SITE_OTHER): Payer: BC Managed Care – PPO | Admitting: Family Medicine

## 2022-07-18 VITALS — BP 98/70 | HR 90 | Temp 98.5°F | Ht 59.5 in | Wt 133.5 lb

## 2022-07-18 DIAGNOSIS — Z0000 Encounter for general adult medical examination without abnormal findings: Secondary | ICD-10-CM | POA: Insufficient documentation

## 2022-07-18 DIAGNOSIS — Z124 Encounter for screening for malignant neoplasm of cervix: Secondary | ICD-10-CM

## 2022-07-18 DIAGNOSIS — R635 Abnormal weight gain: Secondary | ICD-10-CM | POA: Diagnosis not present

## 2022-07-18 LAB — LIPID PANEL
Cholesterol: 211 mg/dL — ABNORMAL HIGH (ref 0–200)
HDL: 70.2 mg/dL (ref >39.00)
LDL Cholesterol: 122 mg/dL — ABNORMAL HIGH (ref 0–99)
NonHDL: 140.97
Total CHOL/HDL Ratio: 3
Triglycerides: 95 mg/dL (ref 0.0–149.0)
VLDL: 19 mg/dL (ref 0.0–40.0)

## 2022-07-18 LAB — COMPREHENSIVE METABOLIC PANEL
ALT: 12 U/L (ref 0–35)
AST: 22 U/L (ref 0–37)
Albumin: 4.4 g/dL (ref 3.5–5.2)
Alkaline Phosphatase: 59 U/L (ref 39–117)
BUN: 20 mg/dL (ref 6–23)
CO2: 31 mEq/L (ref 19–32)
Calcium: 9.7 mg/dL (ref 8.4–10.5)
Chloride: 101 mEq/L (ref 96–112)
Creatinine, Ser: 0.81 mg/dL (ref 0.40–1.20)
GFR: 81.58 mL/min (ref >60.00)
Glucose, Bld: 99 mg/dL (ref 70–99)
Potassium: 5 mEq/L (ref 3.5–5.1)
Sodium: 138 mEq/L (ref 135–145)
Total Bilirubin: 0.4 mg/dL (ref 0.2–1.2)
Total Protein: 8.2 g/dL (ref 6.0–8.3)

## 2022-07-18 LAB — TSH: TSH: 2.07 u[IU]/mL (ref 0.35–5.50)

## 2022-07-18 NOTE — Progress Notes (Addendum)
Complete physical exam  Patient: Erika Wagner [facility]   DOB: [birth year]   56 y.o. Female  MRN: [id number]  Subjective:    Chief Complaint  Patient presents with   Annual Exam    [place] is a 56 y.o. female who presents today for a complete physical exam. She reports consuming a general diet. Home exercise routine includes walking 1 hrs per day. She generally feels well. She reports sleeping well. She does not have additional problems to discuss today.    Most recent fall risk assessment:     No data to display           Most recent depression screenings:    08/22/2022   10:49 AM 07/18/2022    9:50 AM  PHQ 2/9 Scores  PHQ - 2 Score 0 0  PHQ- 9 Score 0 0    Dental: No current dental problems and Receives regular dental care  Patient Active Problem List   Diagnosis Date Noted   Encounter for Papanicolaou smear of cervix 07/18/2022   Normal routine physical examination 07/18/2022   Hordeolum externum left lower eyelid 04/16/2022   Pain in left knee 12/27/2021   Lumbar strain, initial encounter 06/01/2021   Unilateral primary osteoarthritis, left knee 01/01/2020   Osteoporosis 11/23/2019   Seasonal and perennial allergic rhinitis 11/11/2008      Patient Care Team: Farrel Conners, MD as PCP - General (Family Medicine)   Outpatient Medications Prior to Visit  Medication Sig   Boswellia Serrata (BOSWELLIA PO) Take 1 tablet by mouth.   COLLAGEN PO Take by mouth.   Multiple Vitamin (MULTIVITAMIN) tablet Take 1 tablet by mouth daily.   NON FORMULARY daily.   TURMERIC CURCUMIN PO Take 3 tablets by mouth daily.   VITAMIN K PO Take by mouth daily.   No facility-administered medications prior to visit.    Review of Systems  HENT:  Negative for hearing loss.   Eyes:  Negative for blurred vision.  Respiratory:  Negative for shortness of breath.   Cardiovascular:  Negative for chest pain.  Gastrointestinal: Negative.   Genitourinary: Negative.    Musculoskeletal:  Negative for back pain.  Neurological:  Negative for headaches.  Psychiatric/Behavioral:  Negative for depression.   All other systems reviewed and are negative.      Objective:     BP 98/70 (BP Location: Left Arm, Patient Position: Sitting, Cuff Size: Normal)   Pulse 90   Temp 98.5 F (36.9 C) (Oral)   Ht 4' 11.5" (1.511 m)   Wt 133 lb 8 oz (60.6 kg)   LMP 03/20/2016   SpO2 100%   BMI 26.51 kg/m    Physical Exam Vitals reviewed. Exam conducted with a chaperone present.  Constitutional:      Appearance: She is normal weight.  Cardiovascular:     Rate and Rhythm: Normal rate and regular rhythm.     Pulses: Normal pulses.     Heart sounds: Normal heart sounds.  Pulmonary:     Effort: Pulmonary effort is normal.     Breath sounds: Normal breath sounds.  Abdominal:     General: Bowel sounds are normal.     Palpations: Abdomen is soft.  Genitourinary:    General: Normal vulva.     Exam position: Lithotomy position.     Tanner stage (genital): 5.     Vagina: Normal.     Cervix: Normal.     Uterus: Normal.      Adnexa: Right  adnexa normal and left adnexa normal.     Rectum: Normal.  Neurological:     General: No focal deficit present.     Mental Status: She is alert and oriented to person, place, and time.  Psychiatric:        Mood and Affect: Mood and affect normal.      Results for orders placed or performed in visit on 07/18/22  Lipid Panel  Result Value Ref Range   Cholesterol 211 (H) 0 - 200 mg/dL   Triglycerides 95.0 0.0 - 149.0 mg/dL   HDL 70.20 >39.00 mg/dL   VLDL 19.0 0.0 - 40.0 mg/dL   LDL Cholesterol 122 (H) 0 - 99 mg/dL   Total CHOL/HDL Ratio 3    NonHDL 140.97   CMP  Result Value Ref Range   Sodium 138 135 - 145 mEq/L   Potassium 5.0 3.5 - 5.1 mEq/L   Chloride 101 96 - 112 mEq/L   CO2 31 19 - 32 mEq/L   Glucose, Bld 99 70 - 99 mg/dL   BUN 20 6 - 23 mg/dL   Creatinine, Ser 0.81 0.40 - 1.20 mg/dL   Total Bilirubin 0.4 0.2  - 1.2 mg/dL   Alkaline Phosphatase 59 39 - 117 U/L   AST 22 0 - 37 U/L   ALT 12 0 - 35 U/L   Total Protein 8.2 6.0 - 8.3 g/dL   Albumin 4.4 3.5 - 5.2 g/dL   GFR 81.58 >60.00 mL/min   Calcium 9.7 8.4 - 10.5 mg/dL  TSH  Result Value Ref Range   TSH 2.07 0.35 - 5.50 uIU/mL  Cytology - PAP  Result Value Ref Range   High risk HPV Negative    Neisseria Gonorrhea Negative    Chlamydia Negative    Trichomonas Negative    Adequacy      Satisfactory for evaluation; transformation zone component PRESENT.   Diagnosis      - Negative for intraepithelial lesion or malignancy (NILM)   Comment Normal Reference Range HPV - Negative    Comment Normal Reference Range Trichomonas - Negative    Comment Normal Reference Ranger Chlamydia - Negative    Comment      Normal Reference Range Neisseria Gonorrhea - Negative       Assessment & Plan:    Routine Health Maintenance and Physical Exam  Immunization History  Administered Date(s) Administered   PFIZER(Purple Top)SARS-COV-2 Vaccination 11/28/2019, 12/19/2019, 08/29/2020   Td 03/03/2008   Tdap 02/13/2008   Zoster Recombinat (Shingrix) 05/23/2018, 07/23/2018    Health Maintenance  Topic Date Due   DTaP/Tdap/Td (3 - Td or Tdap) 03/03/2018   COVID-19 Vaccine (4 - 2023-24 season) 05/18/2022   INFLUENZA VACCINE  12/16/2022 (Originally 04/17/2022)   Hepatitis C Screening  07/19/2023 (Originally 11/17/1984)   HIV Screening  07/19/2023 (Originally 11/17/1981)   MAMMOGRAM  07/16/2024   COLONOSCOPY (Pts 45-46yrs Insurance coverage will need to be confirmed)  09/17/2026   PAP SMEAR-Modifier  07/19/2027   Zoster Vaccines- Shingrix  Completed   HPV VACCINES  Aged Out    Discussed health benefits of physical activity, and encouraged her to engage in regular exercise appropriate for her age and condition.  Problem List Items Addressed This Visit       Unprioritized   Encounter for Papanicolaou smear of cervix    Normal physical exam findings, mammo  was done previously, pap sent      Relevant Orders   Cytology - PAP (Completed)   Normal routine  physical examination    Normal exam, patinet is due for her yearly bloodwork.       Relevant Orders   Lipid Panel (Completed)   CMP (Completed)   Other Visit Diagnoses     Weight gain finding    -  Primary   Relevant Orders   On physical exam it is noted that the patient has gained >10 pounds in the last year since the last visit. She reports no changes in diet or exercise, will check TSH to rule out thyroid issues causing this weight gain. This should have been documented this way and the TSH ordered this way at the time of the visit on 07/18/2022.  TSH (Completed)      Return in about 1 year (around 07/19/2023) for Annual Physical Exam.     Farrel Conners, MD

## 2022-07-18 NOTE — Assessment & Plan Note (Signed)
Normal exam, patinet is due for her yearly bloodwork.

## 2022-07-18 NOTE — Assessment & Plan Note (Signed)
Normal physical exam findings, mammo was done previously, pap sent

## 2022-07-19 LAB — CYTOLOGY - PAP
Chlamydia: NEGATIVE
Comment: NEGATIVE
Comment: NEGATIVE
Comment: NEGATIVE
Comment: NORMAL
Diagnosis: NEGATIVE
High risk HPV: NEGATIVE
Neisseria Gonorrhea: NEGATIVE
Trichomonas: NEGATIVE

## 2022-07-19 NOTE — Progress Notes (Signed)
Normal mammo

## 2022-07-19 NOTE — Progress Notes (Signed)
Cholesterol is stable from last year, no medication is needed at this time, HDL and TG level are excellent

## 2022-07-20 NOTE — Progress Notes (Signed)
Normal pap, follow up in 5 years

## 2022-08-10 ENCOUNTER — Encounter: Payer: Self-pay | Admitting: Family Medicine

## 2022-08-20 ENCOUNTER — Encounter: Payer: Self-pay | Admitting: Family Medicine

## 2022-08-22 ENCOUNTER — Ambulatory Visit: Payer: BC Managed Care – PPO | Admitting: Family Medicine

## 2022-08-22 ENCOUNTER — Encounter: Payer: Self-pay | Admitting: Family Medicine

## 2022-08-22 VITALS — BP 94/60 | HR 77 | Temp 98.4°F | Ht 59.5 in | Wt 138.5 lb

## 2022-08-22 DIAGNOSIS — J302 Other seasonal allergic rhinitis: Secondary | ICD-10-CM | POA: Diagnosis not present

## 2022-08-22 DIAGNOSIS — J3089 Other allergic rhinitis: Secondary | ICD-10-CM

## 2022-08-22 MED ORDER — METHYLPREDNISOLONE 4 MG PO TBPK: ORAL_TABLET | ORAL | 0 refills | Status: DC

## 2022-08-22 NOTE — Progress Notes (Signed)
Established Patient Office Visit  Subjective   Patient ID: Erika Wagner, female    DOB: [birth year]  Age: 56 y.o. MRN: [id number]  Chief Complaint  Patient presents with   Nasal Congestion    Patient complains of post-nasal drainage x2 weeks, tried OTC antihistamine and a neti-pot with no relief, initially had a fever and chills and suspected she had a sinus infection, states the home Covid test was negative    Pt is reporting 3 week history of post nasal drip-- states it started out as a URI with fever/chills, then that resolved and now the drainage has continued. She has tried multiple medications OTC including antihistamines, just a constant drip. No pressure in her sinuses, no congestion, not really coughing just a constant tickling in her throat. No chest pain, no sore throat, no difficulty breathing. States she did go into the attic to get out her [employer] and was exposed to dust there.     Current Outpatient Medications  Medication Instructions   Boswellia Serrata (BOSWELLIA PO) 1 tablet, Oral   COLLAGEN PO Oral   methylPREDNISolone (MEDROL DOSEPAK) 4 MG TBPK tablet Take package as directed.   Multiple Vitamin (MULTIVITAMIN) tablet 1 tablet, Daily   NON FORMULARY Daily   TURMERIC CURCUMIN PO 3 tablets, Oral, Daily   VITAMIN K PO Oral, Daily       Review of Systems  Constitutional:  Negative for chills and fever.  HENT:  Negative for ear pain.   Respiratory:  Positive for cough. Negative for sputum production and shortness of breath.   Cardiovascular:  Negative for chest pain.  All other systems reviewed and are negative.     Objective:     BP 94/60 (BP Location: Left Arm, Patient Position: Sitting, Cuff Size: Normal)   Pulse 77   Temp 98.4 F (36.9 C) (Oral)   Ht 4' 11.5" (1.511 m)   Wt 138 lb 8 oz (62.8 kg)   LMP 03/20/2016   SpO2 99%   BMI 27.51 kg/m    Physical Exam Vitals reviewed.  Constitutional:      Appearance: Normal appearance.  She is well-groomed and normal weight.  HENT:     Right Ear: Tympanic membrane and ear canal normal.     Left Ear: Tympanic membrane and ear canal normal.     Nose:     Right Sinus: No maxillary sinus tenderness or frontal sinus tenderness.     Left Sinus: No maxillary sinus tenderness or frontal sinus tenderness.     Mouth/Throat:     Mouth: Mucous membranes are moist.     Pharynx: No pharyngeal swelling or posterior oropharyngeal erythema.  Eyes:     Conjunctiva/sclera: Conjunctivae normal.  Cardiovascular:     Rate and Rhythm: Normal rate and regular rhythm.     Pulses: Normal pulses.     Heart sounds: S1 normal and S2 normal.  Pulmonary:     Effort: Pulmonary effort is normal.     Breath sounds: Normal breath sounds and air entry.  Musculoskeletal:     Right lower leg: No edema.     Left lower leg: No edema.  Neurological:     Mental Status: She is alert and oriented to person, place, and time. Mental status is at baseline.     Gait: Gait is intact.  Psychiatric:        Mood and Affect: Mood and affect normal.        Speech: Speech normal.  Behavior: Behavior normal.        Judgment: Judgment normal.      No results found for any visits on 08/22/22.    The 10-year ASCVD risk score (Arnett DK, et al., 2019) is: 0.9%    Assessment & Plan:   Problem List Items Addressed This Visit       Unprioritized   Seasonal and perennial allergic rhinitis - Primary    Sx most likely represent acute flare up of her chronic allergie rhinitis. I recommended using a medrol dose pak, tapering dose over hte next 6 days to see if this will help reduce the immune response. Pt instructed to continue her nettipot rinses and OTC medications for symptom control.  Folow up PRN.      Relevant Medications   methylPREDNISolone (MEDROL DOSEPAK) 4 MG TBPK tablet    No follow-ups on file.    Karie Georges, MD

## 2022-08-22 NOTE — Assessment & Plan Note (Signed)
Sx most likely represent acute flare up of her chronic allergie rhinitis. I recommended using a medrol dose pak, tapering dose over hte next 6 days to see if this will help reduce the immune response. Pt instructed to continue her nettipot rinses and OTC medications for symptom control.  Folow up PRN.

## 2022-08-29 ENCOUNTER — Encounter: Payer: Self-pay | Admitting: Family Medicine

## 2022-08-31 ENCOUNTER — Encounter: Payer: Self-pay | Admitting: Family Medicine

## 2022-08-31 NOTE — Telephone Encounter (Signed)
Please advise 

## 2022-09-03 ENCOUNTER — Encounter: Payer: Self-pay | Admitting: Family Medicine

## 2022-09-03 NOTE — Addendum Note (Signed)
Addended by: Karie Georges on: 09/03/2022 02:04 PM   Modules accepted: Level of Service

## 2022-10-16 IMAGING — MG MM DIGITAL DIAGNOSTIC UNILAT*R* W/ TOMO W/ CAD
6 series · 6 of 14 positions shown · non-contrast
Comparison: Previous exams including diagnostic mammogram dated
06/10/2020.

CLINICAL DATA: Follow-up for probably benign calcifications in the
RIGHT breast. These calcifications were originally identified on
screening mammogram dated 06/01/2020.

EXAM:
DIGITAL DIAGNOSTIC UNILATERAL RIGHT MAMMOGRAM WITH TOMOSYNTHESIS AND
CAD
TECHNIQUE: Right digital diagnostic mammography and breast tomosynthesis was
performed. The images were evaluated with computer-aided detection.

[R CC]
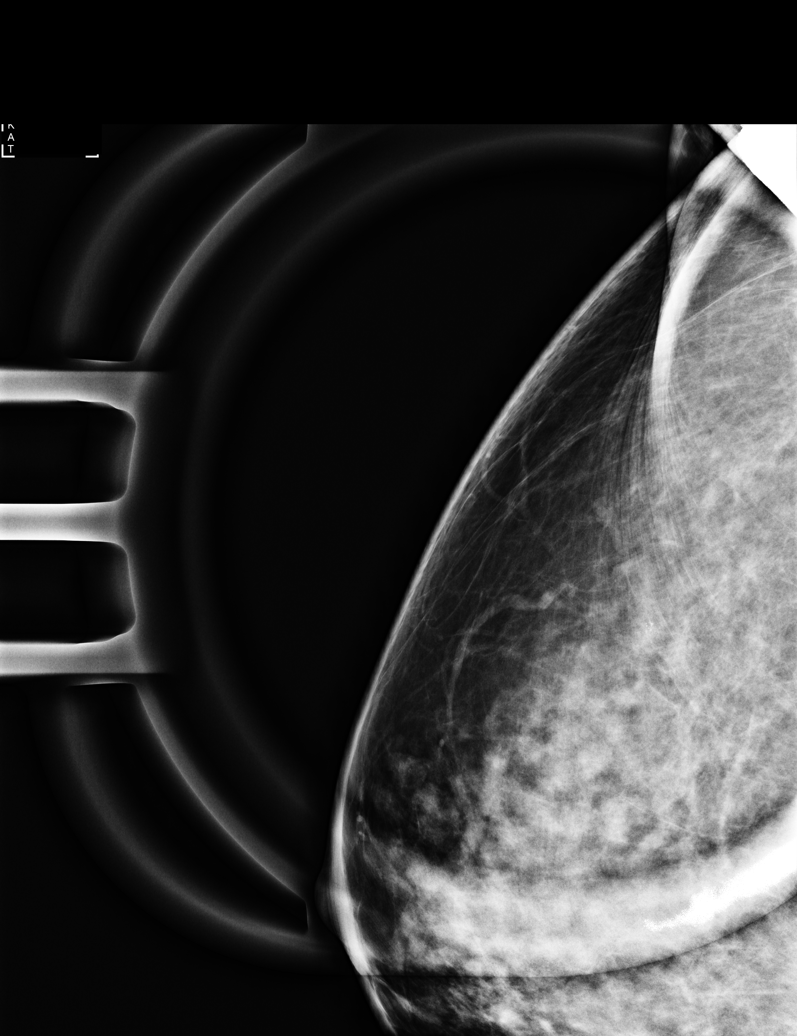

[R ML]
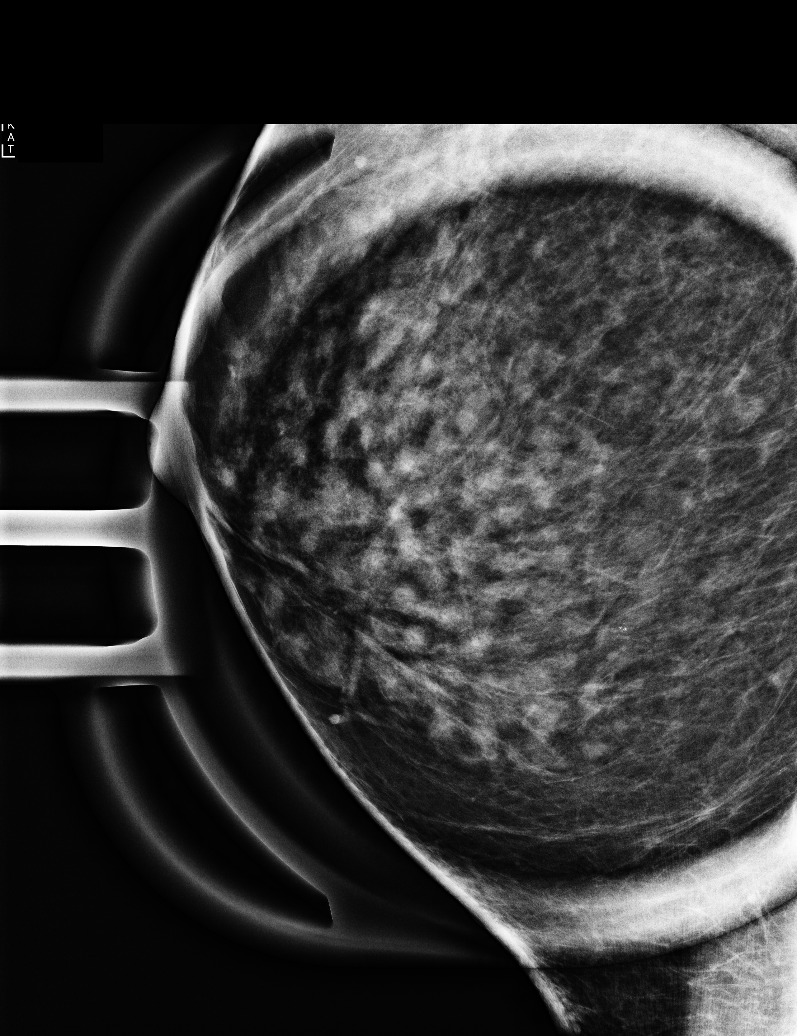

[R MLO synth-2D]
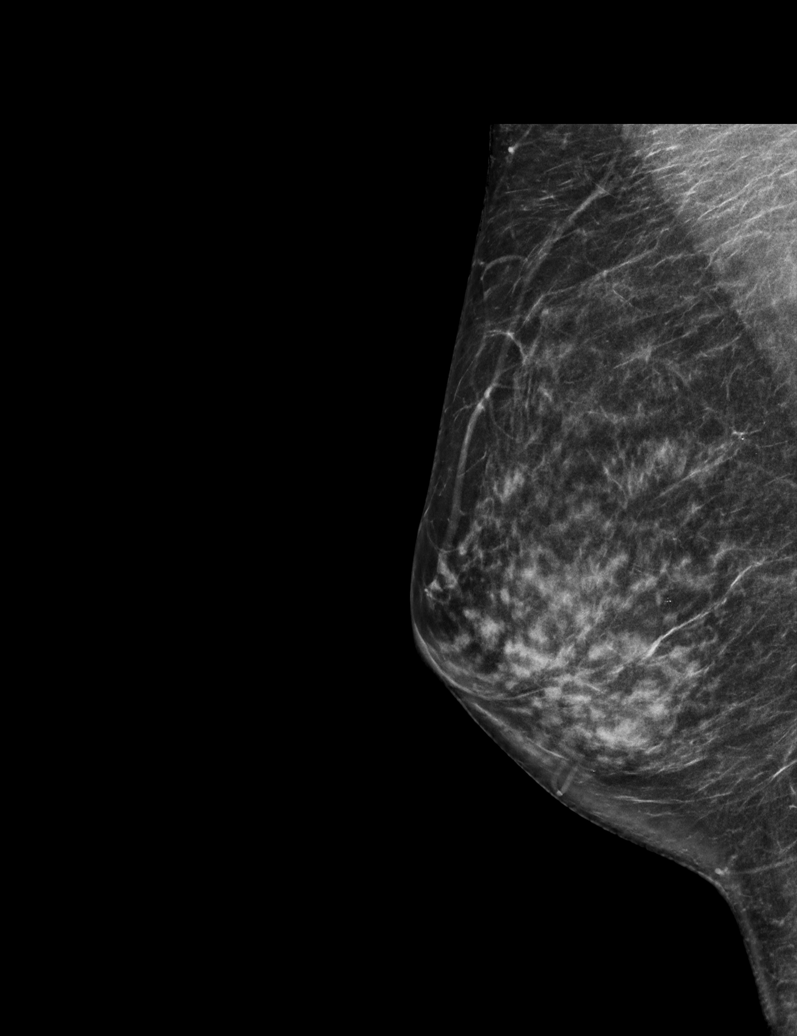

[R CC synth-2D]
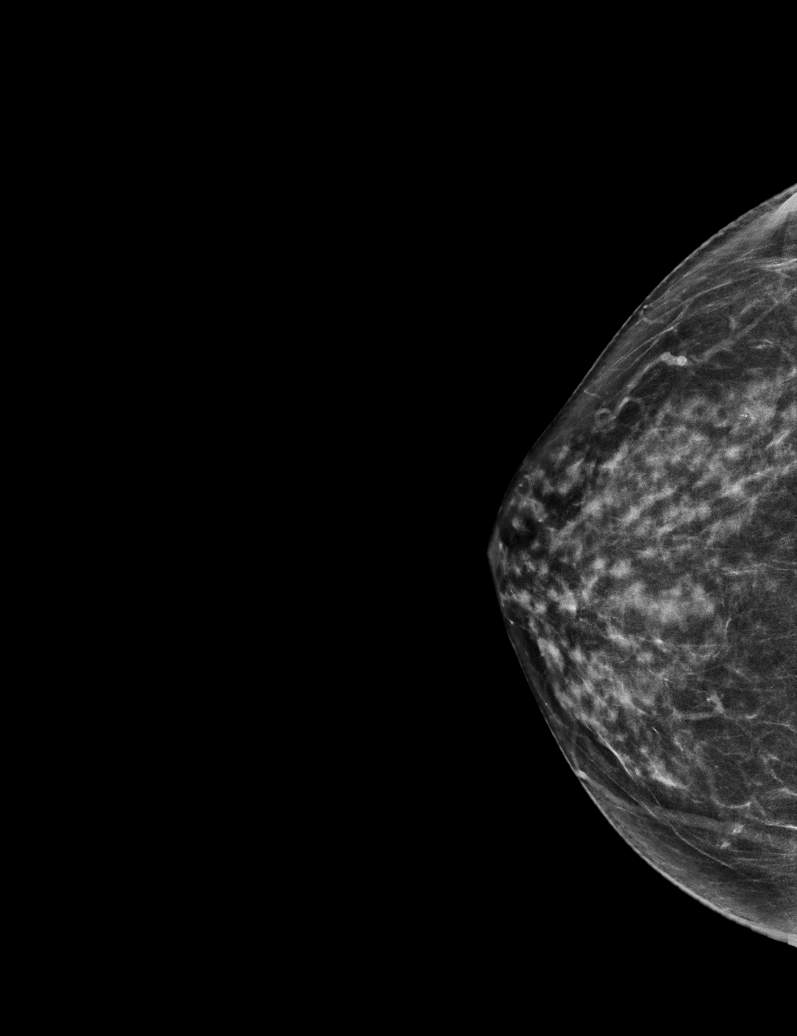

[R CC tomo · tomo slice 30/59.0]
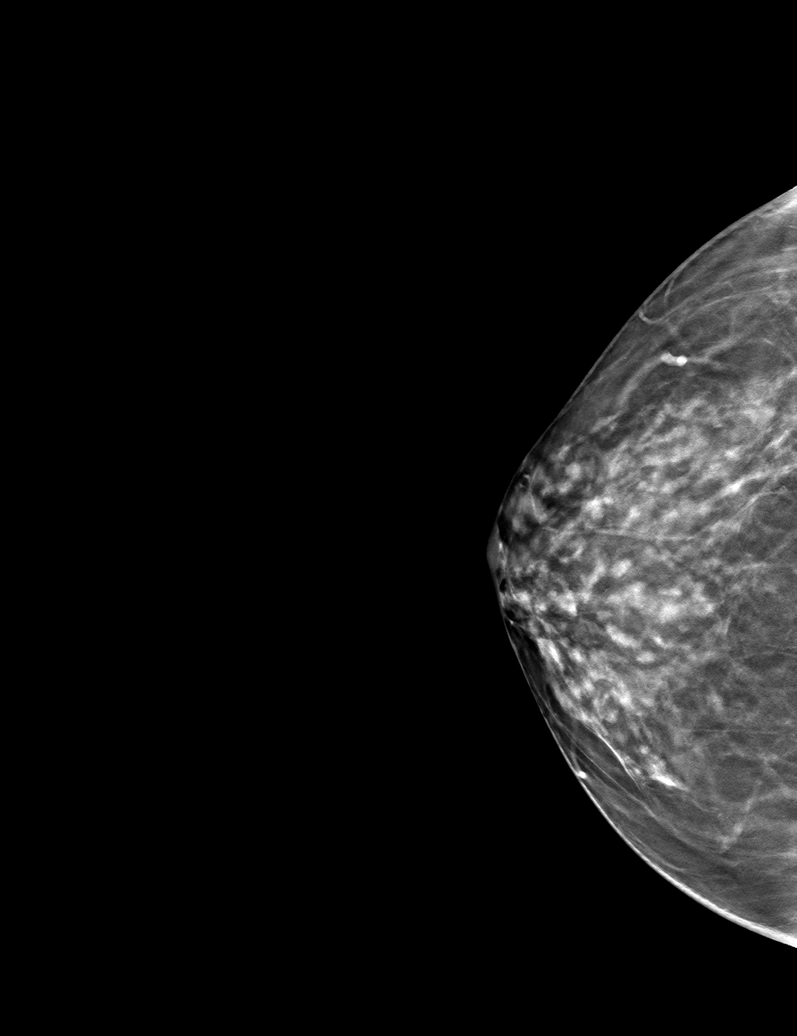

[R MLO tomo · tomo slice 31/61.0]
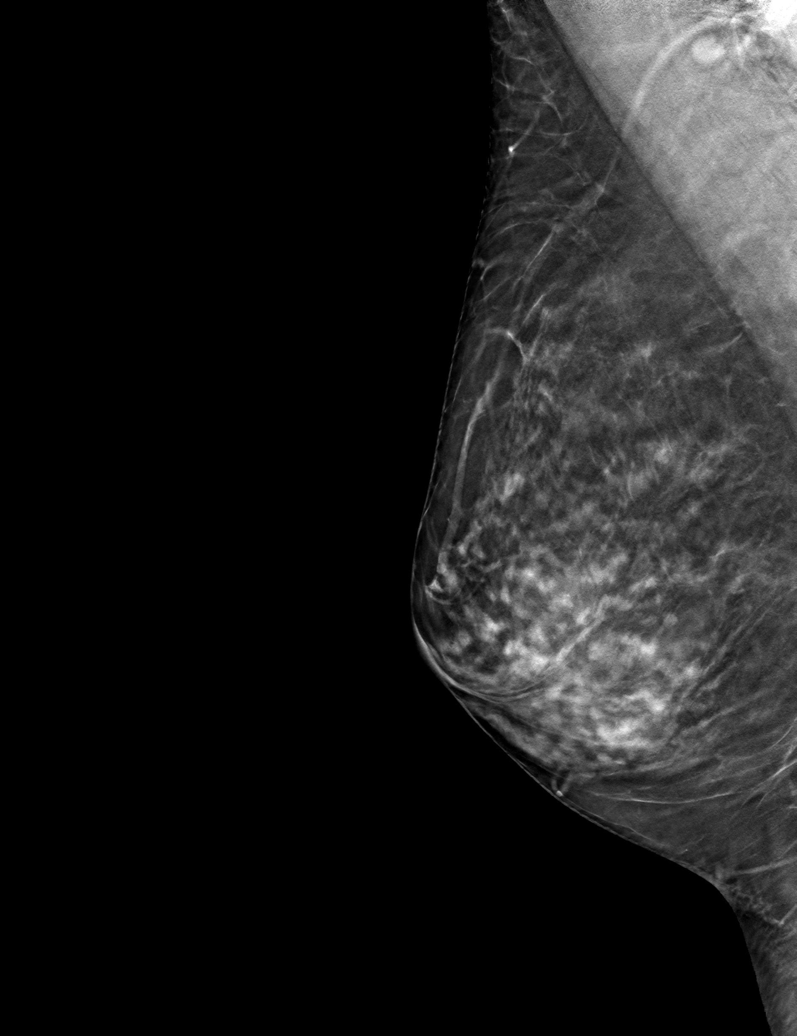

[6 of 14 positions shown; findings below may reference images not displayed]

ACR Breast Density Category c: The breast tissue is heterogeneously
dense, which may obscure small masses.
FINDINGS: The grouped punctate calcifications within the outer RIGHT breast
are stable. There are no new dominant masses, suspicious
calcifications or secondary signs of malignancy within the RIGHT
breast.
IMPRESSION: Stable probably benign calcifications within the outer RIGHT breast.
Recommend additional follow-up diagnostic mammogram in 6 months to
ensure continued stability.

RECOMMENDATION:
Bilateral diagnostic mammogram, with RIGHT breast magnification
views, in 6 months.

I have discussed the findings and recommendations with the patient.
If applicable, a reminder letter will be sent to the patient
regarding the next appointment.

BI-RADS CATEGORY  3: Probably benign.

## 2022-10-24 DIAGNOSIS — D2261 Melanocytic nevi of right upper limb, including shoulder: Secondary | ICD-10-CM | POA: Diagnosis not present

## 2022-10-24 DIAGNOSIS — L821 Other seborrheic keratosis: Secondary | ICD-10-CM | POA: Diagnosis not present

## 2022-10-24 DIAGNOSIS — D2239 Melanocytic nevi of other parts of face: Secondary | ICD-10-CM | POA: Diagnosis not present

## 2022-10-24 DIAGNOSIS — D2271 Melanocytic nevi of right lower limb, including hip: Secondary | ICD-10-CM | POA: Diagnosis not present

## 2022-10-24 DIAGNOSIS — D485 Neoplasm of uncertain behavior of skin: Secondary | ICD-10-CM | POA: Diagnosis not present

## 2022-10-24 DIAGNOSIS — D2262 Melanocytic nevi of left upper limb, including shoulder: Secondary | ICD-10-CM | POA: Diagnosis not present

## 2022-12-31 ENCOUNTER — Encounter: Payer: Self-pay | Admitting: *Deleted

## 2023-05-29 ENCOUNTER — Encounter: Payer: Self-pay | Admitting: Internal Medicine

## 2023-05-29 ENCOUNTER — Ambulatory Visit: Payer: BC Managed Care – PPO | Admitting: Internal Medicine

## 2023-05-29 VITALS — BP 110/70 | HR 110 | Temp 97.9°F | Wt 145.6 lb

## 2023-05-29 DIAGNOSIS — S63602A Unspecified sprain of left thumb, initial encounter: Secondary | ICD-10-CM | POA: Diagnosis not present

## 2023-05-29 MED ORDER — MELOXICAM 7.5 MG PO TABS: 7.5000 mg | ORAL_TABLET | Freq: Every day | ORAL | 0 refills | Status: DC
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
## 2023-06-02 ENCOUNTER — Encounter: Payer: Self-pay | Admitting: Internal Medicine

## 2023-06-03 ENCOUNTER — Other Ambulatory Visit: Payer: Self-pay | Admitting: Family Medicine

## 2023-06-03 DIAGNOSIS — Z1231 Encounter for screening mammogram for malignant neoplasm of breast: Secondary | ICD-10-CM

## 2023-06-13 DIAGNOSIS — M13842 Other specified arthritis, left hand: Secondary | ICD-10-CM | POA: Diagnosis not present

## 2023-06-24 ENCOUNTER — Telehealth: Payer: Self-pay | Admitting: Family Medicine

## 2023-06-24 ENCOUNTER — Encounter: Payer: Self-pay | Admitting: Family Medicine

## 2023-06-24 ENCOUNTER — Ambulatory Visit: Payer: BC Managed Care – PPO | Admitting: Family Medicine

## 2023-06-24 VITALS — BP 100/72 | HR 98 | Temp 98.0°F | Ht 59.5 in | Wt 144.0 lb

## 2023-06-24 DIAGNOSIS — K219 Gastro-esophageal reflux disease without esophagitis: Secondary | ICD-10-CM

## 2023-06-24 MED ORDER — OMEPRAZOLE 20 MG PO CPDR: 20.0000 mg | DELAYED_RELEASE_CAPSULE | Freq: Every day | ORAL | 0 refills | Status: DC
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
## 2023-06-24 NOTE — Telephone Encounter (Signed)
Pt was just seen by Dr. Salomon Fick. Pt called very upset and yelling because according to her she has been sitting in the pharmacy for the last 2 hours waiting for her omeprazole (PRILOSEC) 20 MG capsule . Pt states the pharmacy is telling her they never received this Rx. Pharmacy told Pt she could by this Rx OTC.  Pt is furious that no one told her she could buy it OTC. Pt stated she has things to do and this is ridiculous that we have her waiting around for an Rx that was never sent.  Pt demanded to speak to MD. Pt was informed MD is currently with Pts.  Pt then demanded to speak to MD's LPN. Pt was again informed LPN was also with patients.  Pt stated this is ridiculous and she needs answers.  Pt was assured her message would be sent back and LPN or CMA would be calling her back with clarification.  Pt is demanding a call back for someone to explain to her why the Rx was not sent to the pharmacy &/or why no one told her she could or needed to buy this Rx OTC?

## 2023-06-24 NOTE — Patient Instructions (Signed)
I will include some information about heartburn and peptic ulcers.  Your symptoms of a continued burning sensation in stomach seem more likely that of an ulcer.  A prescription for omeprazole 20 mg daily was sent to your pharmacy.  This is a medication to help reduce the acid in your stomach.  For continued or worsened symptoms follow-up with PCP/a referral to gastroenterologist can be placed.

## 2023-06-25 ENCOUNTER — Encounter: Payer: Self-pay | Admitting: Family Medicine

## 2023-06-25 ENCOUNTER — Other Ambulatory Visit: Payer: Self-pay | Admitting: Internal Medicine

## 2023-06-25 DIAGNOSIS — K219 Gastro-esophageal reflux disease without esophagitis: Secondary | ICD-10-CM

## 2023-06-25 DIAGNOSIS — S63602A Unspecified sprain of left thumb, initial encounter: Secondary | ICD-10-CM

## 2023-06-25 NOTE — Telephone Encounter (Signed)
Called patient left a vm to return call, when AVS was given to the patient it was highlighted what medication was sent and to what pharmacy,

## 2023-07-22 ENCOUNTER — Ambulatory Visit (INDEPENDENT_AMBULATORY_CARE_PROVIDER_SITE_OTHER): Payer: BC Managed Care – PPO | Admitting: Family Medicine

## 2023-07-22 ENCOUNTER — Encounter: Payer: Self-pay | Admitting: Family Medicine

## 2023-07-22 ENCOUNTER — Ambulatory Visit
Admission: RE | Admit: 2023-07-22 | Discharge: 2023-07-22 | Disposition: A | Discharge status: Home / Self Care | Payer: BC Managed Care – PPO | Source: Ambulatory Visit | Attending: Family Medicine | Admitting: Family Medicine

## 2023-07-22 VITALS — BP 112/70 | HR 76 | Temp 98.5°F | Ht 60.75 in | Wt 136.6 lb

## 2023-07-22 DIAGNOSIS — Z0000 Encounter for general adult medical examination without abnormal findings: Secondary | ICD-10-CM

## 2023-07-22 DIAGNOSIS — Z13220 Encounter for screening for lipoid disorders: Secondary | ICD-10-CM | POA: Diagnosis not present

## 2023-07-22 DIAGNOSIS — Z1231 Encounter for screening mammogram for malignant neoplasm of breast: Secondary | ICD-10-CM | POA: Diagnosis not present

## 2023-07-22 LAB — CBC
HCT: 40.5 % (ref 36.0–46.0)
Hemoglobin: 13.4 g/dL (ref 12.0–15.0)
MCHC: 33 g/dL (ref 30.0–36.0)
MCV: 91.8 fL (ref 78.0–100.0)
Platelets: 316 10*3/uL (ref 150.0–400.0)
RBC: 4.41 Mil/uL (ref 3.87–5.11)
RDW: 13 % (ref 11.5–15.5)
WBC: 6.6 10*3/uL (ref 4.0–10.5)

## 2023-07-22 LAB — COMPREHENSIVE METABOLIC PANEL
ALT: 12 U/L (ref 0–35)
AST: 20 U/L (ref 0–37)
Albumin: 4.4 g/dL (ref 3.5–5.2)
Alkaline Phosphatase: 77 U/L (ref 39–117)
BUN: 20 mg/dL (ref 6–23)
CO2: 30 meq/L (ref 19–32)
Calcium: 9.6 mg/dL (ref 8.4–10.5)
Chloride: 99 meq/L (ref 96–112)
Creatinine, Ser: 0.88 mg/dL (ref 0.40–1.20)
GFR: 73.33 mL/min (ref >60.00)
Glucose, Bld: 94 mg/dL (ref 70–99)
Potassium: 4.4 meq/L (ref 3.5–5.1)
Sodium: 136 meq/L (ref 135–145)
Total Bilirubin: 0.5 mg/dL (ref 0.2–1.2)
Total Protein: 7.8 g/dL (ref 6.0–8.3)

## 2023-07-22 LAB — LIPID PANEL
Cholesterol: 212 mg/dL — ABNORMAL HIGH (ref 0–200)
HDL: 59.5 mg/dL (ref >39.00)
LDL Cholesterol: 136 mg/dL — ABNORMAL HIGH (ref 0–99)
NonHDL: 152.53
Total CHOL/HDL Ratio: 4
Triglycerides: 81 mg/dL (ref 0.0–149.0)
VLDL: 16.2 mg/dL (ref 0.0–40.0)

## 2023-07-22 NOTE — Progress Notes (Signed)
Complete physical exam  Patient: Erika Wagner [facility]   DOB: Aug 26, 1966   57 y.o. Female  MRN: [id number]  Subjective:    Chief Complaint  Patient presents with   Annual Exam    Erika Wagner is a 57 y.o. female who presents today for a complete physical exam. She reports consuming a general diet. Home exercise routine includes exercise videos daily. She generally feels well. She reports sleeping well. She does not have additional problems to discuss today.    Most recent fall risk assessment:    06/24/2023    2:52 PM  Fall Risk   Falls in the past year? 0  Number falls in past yr: 0  Injury with Fall? 0  Follow up Falls evaluation completed     Most recent depression screenings:    07/22/2023    9:25 AM 06/24/2023    2:52 PM  PHQ 2/9 Scores  PHQ - 2 Score 0 0  PHQ- 9 Score 1 1    Vision:Within last year and Dental: No current dental problems and Receives regular dental care  Patient Active Problem List   Diagnosis Date Noted   Encounter for Papanicolaou smear of cervix 07/18/2022   Normal routine physical examination 07/18/2022   Hordeolum externum left lower eyelid 04/16/2022   Pain in left knee 12/27/2021   Lumbar strain, initial encounter 06/01/2021   Unilateral primary osteoarthritis, left knee 01/01/2020   Osteoporosis 11/23/2019   Seasonal and perennial allergic rhinitis 11/11/2008      Patient Care Team: Karie Georges, MD as PCP - General (Family Medicine)   Outpatient Medications Prior to Visit  Medication Sig   Boswellia Serrata (BOSWELLIA PO) Take 1 tablet by mouth.   COLLAGEN PO Take by mouth.   Multiple Vitamin (MULTIVITAMIN) tablet Take 1 tablet by mouth daily.   NON FORMULARY daily.   TURMERIC CURCUMIN PO Take 3 tablets by mouth daily.   VITAMIN K PO Take by mouth daily.   [DISCONTINUED] meloxicam (MOBIC) 7.5 MG tablet TAKE 1 TABLET(7.5 MG) BY MOUTH DAILY   [DISCONTINUED] omeprazole (PRILOSEC) 20 MG capsule Take 1 capsule (20 mg total)  by mouth daily.   No facility-administered medications prior to visit.    Review of Systems  HENT:  Negative for hearing loss.   Eyes:  Negative for blurred vision.  Respiratory:  Negative for shortness of breath.   Cardiovascular:  Negative for chest pain.  Gastrointestinal: Negative.   Genitourinary: Negative.   Musculoskeletal:  Negative for back pain.  Neurological:  Negative for headaches.  Psychiatric/Behavioral:  Negative for depression.   All other systems reviewed and are negative.      Objective:     BP 112/70 (BP Location: Left Arm, Patient Position: Sitting, Cuff Size: Normal)   Pulse 76   Temp 98.5 F (36.9 C) (Oral)   Ht 5' 0.75" (1.543 m)   Wt 136 lb 9.6 oz (62 kg)   LMP 03/20/2016   SpO2 98%   BMI 26.02 kg/m    Physical Exam Vitals reviewed.  Constitutional:      Appearance: Normal appearance. She is well-groomed and normal weight.  HENT:     Right Ear: Tympanic membrane and ear canal normal.     Left Ear: Tympanic membrane and ear canal normal.     Mouth/Throat:     Mouth: Mucous membranes are moist.     Pharynx: No posterior oropharyngeal erythema.  Eyes:     Conjunctiva/sclera: Conjunctivae normal.  Neck:  Thyroid: No thyromegaly.  Cardiovascular:     Rate and Rhythm: Normal rate and regular rhythm.     Pulses: Normal pulses.     Heart sounds: S1 normal and S2 normal.  Pulmonary:     Effort: Pulmonary effort is normal.     Breath sounds: Normal breath sounds and air entry.  Abdominal:     General: Abdomen is flat. Bowel sounds are normal.     Palpations: Abdomen is soft.  Musculoskeletal:     Right lower leg: No edema.     Left lower leg: No edema.  Lymphadenopathy:     Cervical: No cervical adenopathy.  Neurological:     Mental Status: She is alert and oriented to person, place, and time. Mental status is at baseline.     Gait: Gait is intact.  Psychiatric:        Mood and Affect: Mood and affect normal.        Speech: Speech  normal.        Behavior: Behavior normal.        Judgment: Judgment normal.     No results found for any visits on 07/22/23.     Assessment & Plan:    Routine Health Maintenance and Physical Exam  Immunization History  Administered Date(s) Administered   PFIZER(Purple Top)SARS-COV-2 Vaccination 11/28/2019, 12/19/2019, 08/29/2020   Td 03/03/2008   Tdap 02/13/2008   Zoster Recombinant(Shingrix) 05/23/2018, 07/23/2018    Health Maintenance  Topic Date Due   DTaP/Tdap/Td (3 - Td or Tdap) 03/03/2018   COVID-19 Vaccine (4 - 2023-24 season) 08/07/2023 (Originally 05/19/2023)   INFLUENZA VACCINE  12/16/2023 (Originally 04/18/2023)   Hepatitis C Screening  07/21/2024 (Originally 11/17/1984)   HIV Screening  07/21/2024 (Originally 11/17/1981)   MAMMOGRAM  07/16/2024   Colonoscopy  09/17/2026   Cervical Cancer Screening (HPV/Pap Cotest)  07/19/2027   Zoster Vaccines- Shingrix  Completed   HPV VACCINES  Aged Out    Discussed health benefits of physical activity, and encouraged her to engage in regular exercise appropriate for her age and condition.  Lipid screening -     Lipid panel  Routine general medical examination at a health care facility -     Comprehensive metabolic panel -     CBC  Normal physical exam findings today, handouts given on healthy eating and exercise. Counseled patient on the GERD diet and recommended she [place] pepcid OTC until she can get in to the GI doctor.   Return in 1 year (on 07/21/2024).     Karie Georges, MD

## 2023-07-22 NOTE — Patient Instructions (Addendum)
[facility] on [address]  [phone]  Health Maintenance, Female Adopting a healthy lifestyle and getting preventive care are important in promoting health and wellness. Ask your health care provider about: The right schedule for you to have regular tests and exams. Things you can do on your own to prevent diseases and keep yourself healthy. What should I know about diet, weight, and exercise? Eat a healthy diet  Eat a diet that includes plenty of vegetables, fruits, low-fat dairy products, and lean protein. Do not eat a lot of foods that are high in solid fats, added sugars, or sodium. Maintain a healthy weight Body mass index (BMI) is used to identify weight problems. It estimates body fat based on height and weight. Your health care provider can help determine your BMI and help you achieve or maintain a healthy weight. Get regular exercise Get regular exercise. This is one of the most important things you can do for your health. Most adults should: Exercise for at least 150 minutes each week. The exercise should increase your heart rate and make you sweat (moderate-intensity exercise). Do strengthening exercises at least twice a week. This is in addition to the moderate-intensity exercise. Spend less time sitting. Even light physical activity can be beneficial. Watch cholesterol and blood lipids Have your blood tested for lipids and cholesterol at 56 years of age, then have this test every 5 years. Have your cholesterol levels checked more often if: Your lipid or cholesterol levels are high. You are older than 56 years of age. You are at high risk for heart disease. What should I know about cancer screening? Depending on your health history and family history, you may need to have cancer screening at various ages. This may include screening for: Breast cancer. Cervical cancer. Colorectal cancer. Skin cancer. Lung cancer. What should I know about heart disease,  diabetes, and high blood pressure? Blood pressure and heart disease High blood pressure causes heart disease and increases the risk of stroke. This is more likely to develop in people who have high blood pressure readings or are overweight. Have your blood pressure checked: Every 3-5 years if you are 41-63 years of age. Every year if you are 85 years old or older. Diabetes Have regular diabetes screenings. This checks your fasting blood sugar level. Have the screening done: Once every three years after age 27 if you are at a normal weight and have a low risk for diabetes. More often and at a younger age if you are overweight or have a high risk for diabetes. What should I know about preventing infection? Hepatitis B If you have a higher risk for hepatitis B, you should be screened for this virus. Talk with your health care provider to find out if you are at risk for hepatitis B infection. Hepatitis C Testing is recommended for: Everyone born from [birth year] through 1964. Anyone with known risk factors for hepatitis C. Sexually transmitted infections (STIs) Get screened for STIs, including gonorrhea and chlamydia, if: You are sexually active and are younger than 56 years of age. You are older than 56 years of age and your health care provider tells you that you are at risk for this type of infection. Your sexual activity has changed since you were last screened, and you are at increased risk for chlamydia or gonorrhea. Ask your health care provider if you are at risk. Ask your health care provider about whether you are at high risk for HIV. Your health care  provider may recommend a prescription medicine to help prevent HIV infection. If you choose to take medicine to prevent HIV, you should first get tested for HIV. You should then be tested every 3 months for as long as you are taking the medicine. Pregnancy If you are about to stop having your period (premenopausal) and you may become pregnant,  seek counseling before you get pregnant. Take 400 to 800 micrograms (mcg) of folic acid every day if you become pregnant. Ask for birth control (contraception) if you want to prevent pregnancy. Osteoporosis and menopause Osteoporosis is a disease in which the bones lose minerals and strength with aging. This can result in bone fractures. If you are 25 years old or older, or if you are at risk for osteoporosis and fractures, ask your health care provider if you should: Be screened for bone loss. Take a calcium or vitamin D supplement to lower your risk of fractures. Be given hormone replacement therapy (HRT) to treat symptoms of menopause. Follow these instructions at home: Alcohol use Do not drink alcohol if: Your health care provider tells you not to drink. You are pregnant, may be pregnant, or are planning to become pregnant. If you drink alcohol: Limit how much you have to: 0-1 drink a day. Know how much alcohol is in your drink. In the [place] one drink equals one 12 oz bottle of beer (355 mL), one 5 oz glass of wine (148 mL), or one 1 oz glass of hard liquor (44 mL). Lifestyle Do not use any products that contain nicotine or tobacco. These products include cigarettes, chewing tobacco, and vaping devices, such as e-cigarettes. If you need help quitting, ask your health care provider. Do not use street drugs. Do not share needles. Ask your health care provider for help if you need support or information about quitting drugs. General instructions Schedule regular health, dental, and eye exams. Stay current with your vaccines. Tell your health care provider if: You often feel depressed. You have ever been abused or do not feel safe at home. Summary Adopting a healthy lifestyle and getting preventive care are important in promoting health and wellness. Follow your health care provider's instructions about healthy diet, exercising, and getting tested or screened for diseases. Follow your  health care provider's instructions on monitoring your cholesterol and blood pressure. This information is not intended to replace advice given to you by your health care provider. Make sure you discuss any questions you have with your health care provider. Document Revised: 01/23/2021 Document Reviewed: 01/23/2021 Elsevier Patient Education  2024 [employer].

## 2023-07-24 ENCOUNTER — Encounter: Payer: Self-pay | Admitting: Family Medicine

## 2023-08-08 ENCOUNTER — Encounter: Payer: Self-pay | Admitting: Gastroenterology

## 2023-09-27 ENCOUNTER — Encounter: Payer: Self-pay | Admitting: Podiatry

## 2023-09-27 ENCOUNTER — Ambulatory Visit: Payer: BLUE CROSS/BLUE SHIELD | Admitting: Podiatry

## 2023-09-27 VITALS — Ht 60.0 in | Wt 136.0 lb

## 2023-09-27 DIAGNOSIS — M21612 Bunion of left foot: Secondary | ICD-10-CM

## 2023-09-27 DIAGNOSIS — M2142 Flat foot [pes planus] (acquired), left foot: Secondary | ICD-10-CM

## 2023-09-27 DIAGNOSIS — M21611 Bunion of right foot: Secondary | ICD-10-CM | POA: Diagnosis not present

## 2023-09-27 DIAGNOSIS — M2141 Flat foot [pes planus] (acquired), right foot: Secondary | ICD-10-CM

## 2023-09-29 ENCOUNTER — Encounter: Payer: Self-pay | Admitting: Podiatry
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
## 2023-10-22 ENCOUNTER — Ambulatory Visit: Payer: BC Managed Care – PPO

## 2023-10-22 NOTE — Progress Notes (Signed)
 Patient was here and I explained how new orthotics would be made, I would make them similar to what she as now I also told her that I can send out to re-furb her old ones as another option Patient then went on to say I have confused her and she doesn't know what to do  She sat and thought about it and then decided to go ahead with a new pair of orthotics  Orthotics   Patient was present and evaluated for Custom molded foot orthotics. Patient will benefit from CFO's to provide total contact to BIL MLA's helping to balance and distribute body weight more evenly across BIL feet helping to reduce plantar pressure and pain. Orthotic will also encourage FF / RF alignment  Patient was scanned today and will return for fitting upon receipt

## 2023-10-25 DIAGNOSIS — D2272 Melanocytic nevi of left lower limb, including hip: Secondary | ICD-10-CM | POA: Diagnosis not present

## 2023-10-25 DIAGNOSIS — D2261 Melanocytic nevi of right upper limb, including shoulder: Secondary | ICD-10-CM | POA: Diagnosis not present

## 2023-10-25 DIAGNOSIS — D2262 Melanocytic nevi of left upper limb, including shoulder: Secondary | ICD-10-CM | POA: Diagnosis not present

## 2023-10-25 DIAGNOSIS — L813 Cafe au lait spots: Secondary | ICD-10-CM | POA: Diagnosis not present

## 2023-10-28 ENCOUNTER — Ambulatory Visit: Payer: BC Managed Care – PPO | Admitting: Gastroenterology

## 2023-11-19 ENCOUNTER — Ambulatory Visit (INDEPENDENT_AMBULATORY_CARE_PROVIDER_SITE_OTHER): Payer: BC Managed Care – PPO

## 2023-11-19 ENCOUNTER — Telehealth: Payer: Self-pay

## 2023-11-19 DIAGNOSIS — M21612 Bunion of left foot: Secondary | ICD-10-CM

## 2023-11-19 DIAGNOSIS — M21611 Bunion of right foot: Secondary | ICD-10-CM

## 2023-11-19 DIAGNOSIS — M2142 Flat foot [pes planus] (acquired), left foot: Secondary | ICD-10-CM

## 2023-11-19 DIAGNOSIS — M79673 Pain in unspecified foot: Secondary | ICD-10-CM

## 2023-11-19 DIAGNOSIS — M2141 Flat foot [pes planus] (acquired), right foot: Secondary | ICD-10-CM | POA: Diagnosis not present

## 2023-11-19 NOTE — Progress Notes (Signed)
 Patient presents today to pick up custom molded foot orthotics, diagnosed with Pes planus by Dr. Jamse Arn .   Orthotics were dispensed and fit was satisfactory. Reviewed instructions for break-in and wear. Written instructions given to patient.  Patient will follow up as needed.  Addison Bailey CPed, CFo, CFm

## 2023-11-19 NOTE — Telephone Encounter (Signed)
 Patients old orthotics sent for refurbish collect $90

## 2023-12-10 ENCOUNTER — Telehealth: Payer: Self-pay

## 2023-12-10 NOTE — Telephone Encounter (Signed)
 SPOKE WITH PT WILL BE PICKING UP REFURBS SOMETIME THIS WEEK, HAS ALREADY PAID HER $90.

## 2024-04-01 ENCOUNTER — Encounter: Payer: Self-pay | Admitting: Family Medicine

## 2024-04-01 ENCOUNTER — Ambulatory Visit: Admitting: Internal Medicine

## 2024-04-01 ENCOUNTER — Ambulatory Visit: Payer: Self-pay

## 2024-04-01 ENCOUNTER — Encounter: Payer: Self-pay | Admitting: Internal Medicine

## 2024-04-01 VITALS — BP 106/60 | HR 62 | Temp 98.6°F | Ht 60.0 in | Wt 136.6 lb

## 2024-04-01 DIAGNOSIS — Z79899 Other long term (current) drug therapy: Secondary | ICD-10-CM

## 2024-04-01 DIAGNOSIS — M62830 Muscle spasm of back: Secondary | ICD-10-CM

## 2024-04-01 MED ORDER — MELOXICAM 7.5 MG PO TABS: ORAL_TABLET | ORAL | 0 refills | Status: DC

## 2024-04-01 MED ORDER — METHOCARBAMOL 500 MG PO TABS: 500.0000 mg | ORAL_TABLET | Freq: Three times a day (TID) | ORAL | 1 refills | Status: DC | PRN

## 2024-04-01 NOTE — Telephone Encounter (Signed)
 APPOINTMENT MADE FOR TODAY 04/01/2024 at 2:30 PM with Dr Apolinar Eastern  FYI Only or Action Required?: FYI only for provider.  Patient was last seen in primary care on 07/22/2023 by Ozell Heron HERO, MD.  Called Nurse Triage reporting Back Pain.  Symptoms began this morning around 9:30-10 am.  Interventions attempted: OTC medications: ibuprofen and Ice/heat application.  Symptoms are: gradually worsening.  Triage Disposition: See HCP Within 4 Hours (Or PCP Triage)  Patient/caregiver understands and will follow disposition?: Yes                             Copied from CRM [id number]. Topic: Clinical - Red Word Triage >> Apr 01, 2024  1:12 PM Shereese L wrote: [facility] that prompted transfer to Nurse Triage: Pulled a muscle in her back and in severe pain Reason for Disposition  [1] SEVERE back pain (e.g., excruciating, unable to do any normal activities) AND [2] not improved 2 hours after pain medicine  Answer Assessment - Initial Assessment Questions Patient states that she sent her provider a message and was sent a message back saying to make an appointment. Patient is advised that if anything worsens 911 is available Patient states that her husband is home with her and she will do her best to get to this appointment. She initially just wanted a prescription for a medication she had in the past when she had back pain similar to this but was advised she needed to make an appointment.   1. ONSET: When did the pain begin? (e.g., minutes, hours, days)     This morning around 9:30-10am 2. LOCATION: Where does it hurt? (upper, mid or lower back)     Lower right side of back   3rd time this has happened--last time was 2022 3. SEVERITY: How bad is the pain?  (e.g., Scale 1-10; mild, moderate, or severe)     7 4. PATTERN: Is the pain constant? (e.g., yes, no; constant, intermittent)      Ache while lying down   worse with movement 5. RADIATION: Does the  pain shoot into your legs or somewhere else?     No 6. CAUSE:  What do you think is causing the back pain?      Patient was scooping her cats litter box when she felt like she pulled a muscle 7. BACK OVERUSE:  Any recent lifting of heavy objects, strenuous work or exercise?     Daily use and just scooping the litter box 8. MEDICINES: What have you taken so far for the pain? (e.g., nothing, acetaminophen , NSAIDS)     Ibuprofen 9. NEUROLOGIC SYMPTOMS: Do you have any weakness, numbness, or problems with bowel/bladder control?     No 10. OTHER SYMPTOMS: Do you have any other symptoms? (e.g., fever, abdomen pain, burning with urination, blood in urine)       No  Protocols used: Back Pain-A-AH

## 2024-04-01 NOTE — Progress Notes (Signed)
 Chief Complaint  Patient presents with   Back Pain    Lower right side back pain from twisting. Ice pack isnt really helping. Ibuprofen was taken today.     HPI: Erika Wagner 58 y.o. come in for  acute onset this am   after lifting cat litter box  and move to side acute pain r l back pain without radiation popping or other  similar to when had same 2022 that took her to the ED and  methocarbamol  helps  Pre pain  ambulatory no problems  active   Methocarbamol  helped in past to be able to move.    Mobic  left over but not sure how to take and if too old  Took husbands oxycodone  in desperation and did no good . ( Was old)?  3 years ago. ? Was last flare.  ROS: See pertinent positives and negatives per HPI.  Past Medical History:  Diagnosis Date   Allergy      Family History  Problem Relation Age of Onset   Diabetes Mother    Hypertension Mother    Hyperlipidemia Mother    Breast cancer Mother 53       BRCA negative   Other Mother        GI bleed   Heart disease Mother        pacemaker   CAD Mother    Renal Disease Mother    Stroke Mother 79       cause of death   Heart attack Father        dscd   Heart disease Father 69       MI   Hypertension Father    Hyperlipidemia Father    Other Paternal Grandfather    Breast cancer Cousin     Social History   Socioeconomic History   Marital status: Married    Spouse name: Not on file   Number of children: Not on file   Years of education: Not on file   Highest education level: Master's degree (e.g., MA, MS, MEng, MEd, MSW, MBA)  Occupational History   Occupation: housewife    Employer: UNEMPLOYED  Tobacco Use   Smoking status: Never   Smokeless tobacco: Never  Vaping Use   Vaping status: Never Used  Substance and Sexual Activity   Alcohol use: No   Drug use: No   Sexual activity: Yes    Partners: Male  Other Topics Concern   Not on file  Social History Narrative   Not on file   Social Drivers of Health    Financial Resource Strain: Low Risk  (08/21/2022)   Overall Financial Resource Strain (CARDIA)    Difficulty of Paying Living Expenses: Not hard at all  Food Insecurity: No Food Insecurity (08/21/2022)   Hunger Vital Sign    Worried About Running Out of Food in the Last Year: Never true    Ran Out of Food in the Last Year: Never true  Transportation Needs: No Transportation Needs (08/21/2022)   PRAPARE - Administrator, Civil Service (Medical): No    Lack of Transportation (Non-Medical): No  Physical Activity: Insufficiently Active (08/21/2022)   Exercise Vital Sign    Days of Exercise per Week: 5 days    Minutes of Exercise per Session: 20 min  Stress: No Stress Concern Present (08/21/2022)   [employer] of Occupational Health - Occupational Stress Questionnaire    Feeling of Stress : Not at all  Social Connections:  Socially Integrated (08/21/2022)   Social Connection and Isolation Panel    Frequency of Communication with Friends and Family: More than three times a week    Frequency of Social Gatherings with Friends and Family: Twice a week    Attends Religious Services: More than 4 times per year    Active Member of [employer] or Organizations: Yes    Attends Engineer, structural: More than 4 times per year    Marital Status: Married    Outpatient Medications Prior to Visit  Medication Sig Dispense Refill   Boswellia Serrata (BOSWELLIA PO) Take 1 tablet by mouth.     COLLAGEN PO Take by mouth.     Multiple Vitamin (MULTIVITAMIN) tablet Take 1 tablet by mouth daily.     TURMERIC CURCUMIN PO Take 3 tablets by mouth daily.     VITAMIN K PO Take by mouth daily.     NON FORMULARY daily. (Patient not taking: Reported on 04/01/2024)     No facility-administered medications prior to visit.     EXAM:  BP 106/60   Pulse 62   Temp 98.6 F (37 C) (Oral)   Ht 5' (1.524 m)   Wt 136 lb 9.6 oz (62 kg)   LMP 03/20/2016   SpO2 98%   BMI 26.68 kg/m   Body mass  index is 26.68 kg/m.  GENERAL: vitals reviewed and listed above, alert, oriented, appears well hydrated and in no acute distress prefers to stand and walks gingerly  Back  no midline point tenderness   right si area and above with tenderness no lesion  toe and heel walk and strength normal  MS: moves all extremities without noticeable focal  abnormality Neuro no gross deficit .  PSYCH: pleasant and cooperative, nl cognition  Lab Results  Component Value Date   WBC 6.6 07/22/2023   HGB 13.4 07/22/2023   HCT 40.5 07/22/2023   PLT 316.0 07/22/2023   GLUCOSE 94 07/22/2023   CHOL 212 (H) 07/22/2023   TRIG 81.0 07/22/2023   HDL 59.50 07/22/2023   LDLCALC 136 (H) 07/22/2023   ALT 12 07/22/2023   AST 20 07/22/2023   NA 136 07/22/2023   K 4.4 07/22/2023   CL 99 07/22/2023   CREATININE 0.88 07/22/2023   BUN 20 07/22/2023   CO2 30 07/22/2023   TSH 2.07 07/18/2022   HGBA1C 6.0 02/23/2010   BP Readings from Last 3 Encounters:  04/01/24 106/60  07/22/23 112/70  06/24/23 100/72    ASSESSMENT AND PLAN:  Discussed the following assessment and plan:  Spasm of muscle of lower back  Medication management Acute  back pain and spasm  arougn r si area   no alam sx hx of same  Ice meds as tolerated  after improved    get back into  maintenance exercises .   For prevention  . If needed fu with PCP and of SM .    Expectant management. -Patient advised to return or notify health care team  if  new concerns arise.  Patient Instructions  Consider  PT also  Ice today and  we can add  on muscle relaxant   as needed.  And meloxicam   7.5 mg  g once or twice a day .    Erika Wagner K. Takeysha Bonk M.D.

## 2024-04-01 NOTE — Telephone Encounter (Signed)
 Appt scheduled with Dr Charlett today at 2:30pm.

## 2024-04-01 NOTE — Patient Instructions (Addendum)
 Consider  PT also  Ice today and  we can add  on muscle relaxant   as needed.  And meloxicam   7.5 mg  g once or twice a day .

## 2024-04-02 ENCOUNTER — Telehealth: Payer: Self-pay | Admitting: *Deleted

## 2024-04-02 NOTE — Telephone Encounter (Signed)
 I suggest we have you add  a pain patch lidocaine  based  There are otc 4% patches to place over th localized area of pain  ( Ask pharmacist if needed... salonpas was one brand but I believe there are many different makers )   This is in  addition tot he muscle relaxant  .  There is a 5 % lidocaine  patch but often not covered by insuance and not that much stronger than the 4 % patch.  It may take up to 3 days to get out of the acute phase of this strain injury.

## 2024-04-02 NOTE — Telephone Encounter (Signed)
 Spoke to pt to follow up.   Pt reports she took one muscle relaxer yesterday at 6:00pm then 2:00am; 2 tab at 10am today. She also took meloxicam  yesterday at 6pm and this morning at 8:30am. Pt added she took 2 tab of tylenol  325mg  at noon today. No relief.

## 2024-04-02 NOTE — Telephone Encounter (Signed)
 Message sent to Dr Lynne teamcare as patient was seen by Dr Charlett on 7/16.

## 2024-04-02 NOTE — Telephone Encounter (Signed)
 Spoke to pt and update her of provider's recommendation. Pt reports the tylenol  she took at noon is helping. Inform her she can take tylenol  if it's helping. If need it, she can try the lidocaine  patch. Pt verbalized understanding.   PCP is out of office until the week July 28, forwarding to covering provider to review.

## 2024-04-02 NOTE — Telephone Encounter (Signed)
 Copied from CRM [id number]. Topic: Clinical - Medical Advice >> Apr 02, 2024  8:01 AM Mercedes MATSU wrote: Reason for CRM: patient states that the medication is not working and she is not getting relief. She said that she would like something else or any other ideas. The best call back number is [phone].

## 2024-04-18 ENCOUNTER — Other Ambulatory Visit: Payer: Self-pay | Admitting: Internal Medicine

## 2024-06-08 ENCOUNTER — Other Ambulatory Visit: Payer: Self-pay | Admitting: Family Medicine

## 2024-06-08 DIAGNOSIS — Z1231 Encounter for screening mammogram for malignant neoplasm of breast: Secondary | ICD-10-CM

## 2024-07-22 ENCOUNTER — Ambulatory Visit (INDEPENDENT_AMBULATORY_CARE_PROVIDER_SITE_OTHER): Payer: BC Managed Care – PPO | Admitting: Family Medicine

## 2024-07-22 ENCOUNTER — Ambulatory Visit
Admission: RE | Admit: 2024-07-22 | Discharge: 2024-07-22 | Disposition: A | Source: Ambulatory Visit | Attending: Family Medicine

## 2024-07-22 ENCOUNTER — Encounter: Payer: Self-pay | Admitting: Family Medicine

## 2024-07-22 VITALS — BP 98/60 | HR 90 | Temp 98.1°F | Ht 59.5 in | Wt 136.5 lb

## 2024-07-22 DIAGNOSIS — Z13220 Encounter for screening for lipoid disorders: Secondary | ICD-10-CM | POA: Diagnosis not present

## 2024-07-22 DIAGNOSIS — Z0000 Encounter for general adult medical examination without abnormal findings: Secondary | ICD-10-CM

## 2024-07-22 DIAGNOSIS — Z1231 Encounter for screening mammogram for malignant neoplasm of breast: Secondary | ICD-10-CM

## 2024-07-22 LAB — COMPREHENSIVE METABOLIC PANEL WITH GFR
ALT: 12 U/L (ref 0–35)
AST: 22 U/L (ref 0–37)
Albumin: 4.4 g/dL (ref 3.5–5.2)
Alkaline Phosphatase: 72 U/L (ref 39–117)
BUN: 21 mg/dL (ref 6–23)
CO2: 29 meq/L (ref 19–32)
Calcium: 9.6 mg/dL (ref 8.4–10.5)
Chloride: 100 meq/L (ref 96–112)
Creatinine, Ser: 0.83 mg/dL (ref 0.40–1.20)
GFR: 78.12 mL/min (ref >60.00)
Glucose, Bld: 89 mg/dL (ref 70–99)
Potassium: 5.2 meq/L — ABNORMAL HIGH (ref 3.5–5.1)
Sodium: 138 meq/L (ref 135–145)
Total Bilirubin: 0.5 mg/dL (ref 0.2–1.2)
Total Protein: 7.9 g/dL (ref 6.0–8.3)

## 2024-07-22 LAB — CBC WITH DIFFERENTIAL/PLATELET
Basophils Absolute: 0.1 K/uL (ref 0.0–0.1)
Basophils Relative: 1.4 % (ref 0.0–3.0)
Eosinophils Absolute: 0.2 K/uL (ref 0.0–0.7)
Eosinophils Relative: 2.8 % (ref 0.0–5.0)
HCT: 41.7 % (ref 36.0–46.0)
Hemoglobin: 13.8 g/dL (ref 12.0–15.0)
Lymphocytes Relative: 28 % (ref 12.0–46.0)
Lymphs Abs: 1.7 K/uL (ref 0.7–4.0)
MCHC: 33.1 g/dL (ref 30.0–36.0)
MCV: 91.9 fl (ref 78.0–100.0)
Monocytes Absolute: 0.4 K/uL (ref 0.1–1.0)
Monocytes Relative: 7.5 % (ref 3.0–12.0)
Neutro Abs: 3.6 K/uL (ref 1.4–7.7)
Neutrophils Relative %: 60.3 % (ref 43.0–77.0)
Platelets: 306 K/uL (ref 150.0–400.0)
RBC: 4.54 Mil/uL (ref 3.87–5.11)
RDW: 13.4 % (ref 11.5–15.5)
WBC: 6 K/uL (ref 4.0–10.5)

## 2024-07-22 LAB — LIPID PANEL
Cholesterol: 234 mg/dL — ABNORMAL HIGH (ref 0–200)
HDL: 72.6 mg/dL (ref >39.00)
LDL Cholesterol: 138 mg/dL — ABNORMAL HIGH (ref 0–99)
NonHDL: 161.43
Total CHOL/HDL Ratio: 3
Triglycerides: 119 mg/dL (ref 0.0–149.0)
VLDL: 23.8 mg/dL (ref 0.0–40.0)

## 2024-07-22 NOTE — Patient Instructions (Signed)

## 2024-07-22 NOTE — Progress Notes (Signed)
 Complete physical exam  Patient: Erika Wagner [facility]   DOB: [birth year]   58 y.o. Female  MRN: [id number]  Subjective:    Chief Complaint  Patient presents with   Annual Exam    Demetri Tedi Hughson is a 58 y.o. female who presents today for a complete physical exam. She reports consuming a general diet. Eats fruits and vegetables daily, gets good sources of protein, tries to avoid processed foods, Home exercise routine includes walking 2  hrs per week. She generally feels well. She reports sleeping somewhat poorly, sometimes has nighttime awakenings sometimes but otherwise doesn't feel groggy in the morning. She does not have additional problems to discuss today.    Most recent fall risk assessment:    04/01/2024    2:33 PM  Fall Risk   Falls in the past year? 0  Number falls in past yr: 0  Injury with Fall? 0  Risk for fall due to : No Fall Risks  Follow up Falls evaluation completed     Most recent depression screenings:    07/22/2024    8:44 AM 04/01/2024    2:33 PM  PHQ 2/9 Scores  PHQ - 2 Score 0 0  PHQ- 9 Score 0     Vision:Within last year and Dental: No current dental problems and Receives regular dental care  Patient Active Problem List   Diagnosis Date Noted   Encounter for Papanicolaou smear of cervix 07/18/2022   Normal routine physical examination 07/18/2022   Hordeolum externum left lower eyelid 04/16/2022   Pain in left knee 12/27/2021   Lumbar strain, initial encounter 06/01/2021   Unilateral primary osteoarthritis, left knee 01/01/2020   Osteoporosis 11/23/2019   Seasonal and perennial allergic rhinitis 11/11/2008      Patient Care Team: Ozell Heron HERO, MD as PCP - General (Family Medicine)   Outpatient Medications Prior to Visit  Medication Sig   Boswellia Serrata (BOSWELLIA PO) Take 1 tablet by mouth.   COLLAGEN PO Take by mouth.   Multiple Vitamin (MULTIVITAMIN) tablet Take 1 tablet by mouth daily.   NON FORMULARY daily.   TURMERIC CURCUMIN  PO Take 3 tablets by mouth daily.   VITAMIN K PO Take by mouth daily.   [DISCONTINUED] meloxicam  (MOBIC ) 7.5 MG tablet TAKE 1 TABLET BY MOUTH TWICE DAILY AS NEEDED FOR PAIN   [DISCONTINUED] methocarbamol  (ROBAXIN ) 500 MG tablet Take 1-2 tablets (500-1,000 mg total) by mouth every 8 (eight) hours as needed for muscle spasms.   No facility-administered medications prior to visit.    Review of Systems  HENT:  Negative for hearing loss.   Eyes:  Negative for blurred vision.  Respiratory:  Negative for shortness of breath.   Cardiovascular:  Negative for chest pain.  Gastrointestinal: Negative.   Genitourinary: Negative.   Musculoskeletal:  Negative for back pain.  Neurological:  Negative for headaches.  Psychiatric/Behavioral:  Negative for depression.   All other systems reviewed and are negative.      Objective:     BP 98/60   Pulse 90   Temp 98.1 F (36.7 C) (Oral)   Ht 4' 11.5 (1.511 m)   Wt 136 lb 8 oz (61.9 kg)   LMP 03/20/2016   SpO2 98%   BMI 27.11 kg/m    Physical Exam Vitals reviewed.  Constitutional:      Appearance: Normal appearance. She is well-groomed and normal weight.  HENT:     Right Ear: Tympanic membrane and ear canal normal.  Left Ear: Tympanic membrane and ear canal normal.     Mouth/Throat:     Mouth: Mucous membranes are moist.     Pharynx: No posterior oropharyngeal erythema.  Eyes:     Conjunctiva/sclera: Conjunctivae normal.  Neck:     Thyroid : No thyromegaly.  Cardiovascular:     Rate and Rhythm: Normal rate and regular rhythm.     Pulses: Normal pulses.     Heart sounds: S1 normal and S2 normal.  Pulmonary:     Effort: Pulmonary effort is normal.     Breath sounds: Normal breath sounds and air entry.  Abdominal:     General: Abdomen is flat. Bowel sounds are normal.     Palpations: Abdomen is soft.  Musculoskeletal:     Right lower leg: No edema.     Left lower leg: No edema.  Lymphadenopathy:     Cervical: No cervical  adenopathy.  Neurological:     Mental Status: She is alert and oriented to person, place, and time. Mental status is at baseline.     Gait: Gait is intact.  Psychiatric:        Mood and Affect: Mood and affect normal.        Speech: Speech normal.        Behavior: Behavior normal.        Judgment: Judgment normal.    The 10-year ASCVD risk score (Arnett DK, et al., 2019) is: 1.4%   Values used to calculate the score:     Age: 30 years     Clincally relevant sex: Female     Is Non-Hispanic African American: No     Diabetic: No     Tobacco smoker: No     Systolic Blood Pressure: 98 mmHg     Is BP treated: No     HDL Cholesterol: 59.5 mg/dL     Total Cholesterol: 212 mg/dL   No results found for any visits on 07/22/24.     Assessment & Plan:    Routine Health Maintenance and Physical Exam  Immunization History  Administered Date(s) Administered   PFIZER(Purple Top)SARS-COV-2 Vaccination 11/28/2019, 12/19/2019, 08/29/2020   Td 03/03/2008   Tdap 02/13/2008   Zoster Recombinant(Shingrix) 05/23/2018, 07/23/2018    Health Maintenance  Topic Date Due   Hepatitis B Vaccines 19-59 Average Risk (1 of 3 - 19+ 3-dose series) Never done   Pneumococcal Vaccine: 50+ Years (1 of 1 - PCV) Never done   COVID-19 Vaccine (4 - 2025-26 season) 05/18/2024   Influenza Vaccine  12/15/2024 (Originally 04/17/2024)   DTaP/Tdap/Td (3 - Td or Tdap) 07/22/2025 (Originally 03/03/2018)   Hepatitis C Screening  07/22/2025 (Originally 11/17/1984)   HIV Screening  07/22/2025 (Originally 11/17/1981)   Mammogram  07/21/2025   Colonoscopy  04/27/2027   Cervical Cancer Screening (HPV/Pap Cotest)  07/19/2027   Zoster Vaccines- Shingrix  Completed   HPV VACCINES  Aged Out   Meningococcal B Vaccine  Aged Out    Discussed health benefits of physical activity, and encouraged her to engage in regular exercise appropriate for her age and condition.  Lipid screening -     Lipid panel; Future  Routine general  medical examination at a health care facility -     Comprehensive metabolic panel with GFR; Future -     CBC with Differential/Platelet; Future  General physical exam findings are normal today. I reviewed the patient's preventative testing, immunizations, and lifestyle habits. I made appropriate recommendations and placed orders for the appropriate  tests and/or vaccinations. I counseled the patient on the CDC's recommendations for healthy exercise and diet. I counseled the patient on healthy sleep habits and stress management. Handouts to reinforce the counseling were given at the conclusion of the visit.    Return in 1 year (on 07/22/2025).     Heron CHRISTELLA Sharper, MD

## 2024-07-27 ENCOUNTER — Other Ambulatory Visit: Payer: Self-pay | Admitting: Family Medicine

## 2024-07-27 DIAGNOSIS — R928 Other abnormal and inconclusive findings on diagnostic imaging of breast: Secondary | ICD-10-CM

## 2024-08-04 ENCOUNTER — Other Ambulatory Visit: Payer: Self-pay | Admitting: Family Medicine

## 2024-08-04 ENCOUNTER — Ambulatory Visit: Payer: Self-pay | Admitting: Family Medicine

## 2024-08-04 ENCOUNTER — Ambulatory Visit
Admission: RE | Admit: 2024-08-04 | Discharge: 2024-08-04 | Disposition: A | Discharge status: Home / Self Care | Source: Ambulatory Visit | Attending: Family Medicine | Admitting: Family Medicine

## 2024-08-04 DIAGNOSIS — R921 Mammographic calcification found on diagnostic imaging of breast: Secondary | ICD-10-CM

## 2024-08-04 DIAGNOSIS — R920 Mammographic microcalcification found on diagnostic imaging of breast: Secondary | ICD-10-CM | POA: Diagnosis not present

## 2024-08-04 DIAGNOSIS — R928 Other abnormal and inconclusive findings on diagnostic imaging of breast: Secondary | ICD-10-CM

## 2024-08-10 ENCOUNTER — Ambulatory Visit: Payer: Self-pay | Admitting: Family Medicine

## 2024-08-12 ENCOUNTER — Ambulatory Visit
Admission: RE | Admit: 2024-08-12 | Discharge: 2024-08-12 | Disposition: A | Discharge status: Home / Self Care | Source: Ambulatory Visit | Attending: Family Medicine | Admitting: Family Medicine

## 2024-08-12 DIAGNOSIS — R921 Mammographic calcification found on diagnostic imaging of breast: Secondary | ICD-10-CM

## 2024-08-12 DIAGNOSIS — R928 Other abnormal and inconclusive findings on diagnostic imaging of breast: Secondary | ICD-10-CM

## 2024-08-12 DIAGNOSIS — N6489 Other specified disorders of breast: Secondary | ICD-10-CM | POA: Diagnosis not present

## 2024-08-12 HISTORY — PX: BREAST BIOPSY: SHX20

## 2024-08-14 LAB — SURGICAL PATHOLOGY

## 2025-07-23 ENCOUNTER — Encounter: Admitting: Family Medicine

## 2025-07-27 ENCOUNTER — Encounter: Admitting: Family Medicine
